# Patient Record
Sex: Female | Born: 1960
Health system: Southern US, Community
[De-identification: ages and names within clinical notes are randomized; demographics above are authoritative.]

## PROBLEM LIST (undated history)

## (undated) DIAGNOSIS — J45909 Unspecified asthma, uncomplicated: Secondary | ICD-10-CM

## (undated) DIAGNOSIS — I10 Essential (primary) hypertension: Secondary | ICD-10-CM

## (undated) DIAGNOSIS — Z1211 Encounter for screening for malignant neoplasm of colon: Secondary | ICD-10-CM

## (undated) DIAGNOSIS — E119 Type 2 diabetes mellitus without complications: Secondary | ICD-10-CM

## (undated) DIAGNOSIS — J449 Chronic obstructive pulmonary disease, unspecified: Secondary | ICD-10-CM

## (undated) HISTORY — PX: PARTIAL HYSTERECTOMY: SHX80

---
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
## 2018-09-06 ENCOUNTER — Emergency Department (HOSPITAL_COMMUNITY)
Admission: EM | Admit: 2018-09-06 | Discharge: 2018-09-06 | Disposition: A | Discharge status: Home / Self Care | Payer: Medicare Other | Attending: Emergency Medicine | Admitting: Emergency Medicine

## 2018-09-06 ENCOUNTER — Emergency Department (HOSPITAL_COMMUNITY): Payer: Medicare Other

## 2018-09-06 DIAGNOSIS — R05 Cough: Secondary | ICD-10-CM | POA: Diagnosis present

## 2018-09-06 DIAGNOSIS — J441 Chronic obstructive pulmonary disease with (acute) exacerbation: Secondary | ICD-10-CM

## 2018-09-06 DIAGNOSIS — R0981 Nasal congestion: Secondary | ICD-10-CM | POA: Diagnosis not present

## 2018-09-06 DIAGNOSIS — R062 Wheezing: Secondary | ICD-10-CM | POA: Diagnosis not present

## 2018-09-06 LAB — CBC
HCT: 39.6 % (ref 36.0–46.0)
Hemoglobin: 12.7 g/dL (ref 12.0–15.0)
MCH: 28 pg (ref 26.0–34.0)
MCHC: 32.1 g/dL (ref 30.0–36.0)
MCV: 87.2 fL (ref 80.0–100.0)
PLATELETS: 244 10*3/uL (ref 150–400)
RBC: 4.54 MIL/uL (ref 3.87–5.11)
RDW: 12.7 % (ref 11.5–15.5)
WBC: 10.2 10*3/uL (ref 4.0–10.5)
nRBC: 0 % (ref 0.0–0.2)

## 2018-09-06 LAB — INFLUENZA PANEL BY PCR (TYPE A & B)
INFLAPCR: NEGATIVE
Influenza B By PCR: NEGATIVE

## 2018-09-06 LAB — BASIC METABOLIC PANEL
Anion gap: 9 (ref 5–15)
BUN: 11 mg/dL (ref 6–20)
CO2: 26 mmol/L (ref 22–32)
Calcium: 9 mg/dL (ref 8.9–10.3)
Chloride: 103 mmol/L (ref 98–111)
Creatinine, Ser: 0.88 mg/dL (ref 0.44–1.00)
GFR calc Af Amer: >60 mL/min (ref >60)
Glucose, Bld: 216 mg/dL — ABNORMAL HIGH (ref 70–99)
Potassium: 3.5 mmol/L (ref 3.5–5.1)
SODIUM: 138 mmol/L (ref 135–145)

## 2018-09-06 MED ORDER — AZITHROMYCIN 250 MG PO TABS: 250.0000 mg | ORAL_TABLET | Freq: Every day | ORAL | 0 refills | Status: DC

## 2018-09-06 MED ORDER — BENZONATATE 100 MG PO CAPS: 100.0000 mg | ORAL_CAPSULE | Freq: Three times a day (TID) | ORAL | 0 refills | Status: DC

## 2018-09-06 MED ORDER — BENZONATATE 100 MG PO CAPS
100.0000 mg | ORAL_CAPSULE | Freq: Once | ORAL | Status: AC
  Administered 2018-09-06: 100 mg via ORAL
  Filled 2018-09-06: qty 1

## 2018-09-06 MED ORDER — IPRATROPIUM-ALBUTEROL 0.5-2.5 (3) MG/3ML IN SOLN
3.0000 mL | Freq: Once | RESPIRATORY_TRACT | Status: AC
  Administered 2018-09-06: 3 mL via RESPIRATORY_TRACT
  Filled 2018-09-06: qty 3

## 2018-09-06 MED ORDER — METHYLPREDNISOLONE SODIUM SUCC 125 MG IJ SOLR
125.0000 mg | Freq: Once | INTRAMUSCULAR | Status: AC
  Administered 2018-09-06: 125 mg via INTRAVENOUS
  Filled 2018-09-06: qty 2

## 2018-09-06 MED ORDER — ALBUTEROL SULFATE (2.5 MG/3ML) 0.083% IN NEBU
2.5000 mg | INHALATION_SOLUTION | Freq: Once | RESPIRATORY_TRACT | Status: AC
  Administered 2018-09-06: 2.5 mg via RESPIRATORY_TRACT
  Filled 2018-09-06: qty 3

## 2018-09-06 MED ORDER — PREDNISONE 20 MG PO TABS: 20.0000 mg | ORAL_TABLET | Freq: Every day | ORAL | 0 refills | Status: AC

## 2018-09-06 NOTE — ED Triage Notes (Signed)
Transported by [facility] from work--productive cough (yellow sputum), SHOB, sore throat, body aches since Monday. VSS with EMS.

## 2018-09-06 NOTE — ED Notes (Signed)
ED Provider at bedside. 

## 2018-09-06 NOTE — ED Notes (Signed)
Bed: WA17 Expected date: 09/06/18 Expected time: 8:43 AM Means of arrival:  Comments: EMS/cough/d.m.

## 2018-09-06 NOTE — ED Provider Notes (Signed)
[name] Provider Note   CSN: [id number] Arrival date & time: 09/06/18  0906     History   Chief Complaint Chief Complaint  Patient presents with  . Cough    HPI Lori Brown is a 57 y.o. female.  HPI 57 year old female, with PMH of COPD, asthma, daily smoker, diabetes, HTN, HLD, presents with a 3-day history of cough with sputum production.  She notes associated wheezing but is only present with coughing.  She denies any shortness of breath.  She notes associated rhinorrhea, nasal congestion, sore throat and body aches.  She denies fevers, chills.  She denies any chest pain.  Patient states "I feel like I have the flu."  States she has been taking her inhalers with no improvement in her symptoms.   No past medical history on file.  There are no active problems to display for this patient.   The histories are not reviewed yet. Please review them in the "History" navigator section and refresh this [place].   OB History   None      Home Medications    Prior to Admission medications   Not on File    Family History No family history on file.  Social History Social History   Tobacco Use  . Smoking status: Not on file  Substance Use Topics  . Alcohol use: Not on file  . Drug use: Not on file     Allergies   Patient has no allergy information on record.   Review of Systems Review of Systems  Constitutional: Negative for chills and fever.  HENT: Positive for rhinorrhea and sore throat.   Eyes: Positive for pain and redness.  Respiratory: Positive for cough and wheezing. Negative for shortness of breath.   Cardiovascular: Negative for chest pain.  Gastrointestinal: Negative for abdominal pain, nausea and vomiting.  Musculoskeletal: Positive for myalgias.     Physical Exam Updated Vital Signs BP (!) 138/102 (BP Location: Right Arm)   Pulse 86   Temp 98.6 F (37 C) (Oral)   Resp 20   SpO2 97%   Physical  Exam  Constitutional: She is oriented to person, place, and time. She appears well-developed and well-nourished.  HENT:  Head: Normocephalic and atraumatic.  Right Ear: Tympanic membrane normal.  Left Ear: Tympanic membrane normal.  Nose: Rhinorrhea present. No epistaxis.  Mouth/Throat: Uvula is midline. Posterior oropharyngeal erythema (mild) present.  Eyes: Conjunctivae and EOM are normal.  Neck: Neck supple.  Cardiovascular: Normal rate, regular rhythm and normal heart sounds.  No murmur heard. Pulmonary/Chest: Effort normal. No stridor. No respiratory distress. She has wheezes (expiratory wheezing throughout). She has no rales.  Abdominal: Soft. Bowel sounds are normal. She exhibits no distension. There is no tenderness.  Musculoskeletal: Normal range of motion. She exhibits no tenderness or deformity.  Neurological: She is alert and oriented to person, place, and time.  Skin: Skin is warm and dry. No rash noted. No erythema.  Psychiatric: She has a normal mood and affect. Her behavior is normal.  Nursing note and vitals reviewed.    ED Treatments / Results  Labs (all labs ordered are listed, but only abnormal results are displayed) Labs Reviewed  CBC  BASIC METABOLIC PANEL  INFLUENZA PANEL BY PCR (TYPE A & B)    EKG None  Radiology No results found.  Procedures Procedures (including critical care time)  Medications Ordered in ED Medications  methylPREDNISolone sodium succinate (SOLU-MEDROL) 125 mg/2 mL injection 125 mg (125 mg  Intravenous Given 09/06/18 0939)  ipratropium-albuterol (DUONEB) 0.5-2.5 (3) MG/3ML nebulizer solution 3 mL (3 mLs Nebulization Given 09/06/18 0949)  albuterol (PROVENTIL) (2.5 MG/3ML) 0.083% nebulizer solution 2.5 mg (2.5 mg Nebulization Given 09/06/18 0949)     Initial Impression / Assessment and Plan / ED Course  I have reviewed the triage vital signs and the nursing notes.  Pertinent labs & imaging results that were available during  my care of the patient were reviewed by me and considered in my medical decision making (see chart for details).     Resting comfortably in bed, no acute distress, nontoxic, non-lethargic.  Vital signs are stable.  Lungs are evaluated and patient moving much more air with only faint expiratory wheezes heard.  She states that she is feeling better at this time after nebs and steroids.  Continues to deny any shortness of breath, chest pain.  Does note states shortness of breath however when asked further patient notes that she is wheezing.  And afebrile, not tachycardic, O2 saturations are good.  Blood work unremarkable, chest x-ray with no pneumonia but possible acute on chronic bronchitis.  Believe this is a COPD exacerbation, will write for steroids and azithromycin for home.  Patient is diabetic.  Given strict instructions to monitor her sugars.  She expresses understanding.  Patient states she has plenty of albuterol at home.  She was instructed to take it every 4 hours for the next several days.   At this time there does not appear to be any evidence of an acute emergency medical condition and the patient appears stable for discharge with appropriate outpatient follow up.Diagnosis was discussed with patient who verbalizes understanding and is agreeable to discharge.  Final Clinical Impressions(s) / ED Diagnoses   Final diagnoses:  None    ED Discharge Orders    None       Etter Sjogren, PA-C 09/07/18 0700    Julianne Rice, MD 09/09/18 [phone]

## 2018-09-06 NOTE — Discharge Instructions (Signed)
Start Azithromycin today and take as prescribed. Take prednisone starting tomorrow. Please monitor your sugars as prednisone can cause your sugar to be elevated.  Take albuterol every 4 hours for wheezing.  Return to the ED immediately for new or worsening symptoms, such as shortness of breath, fever, chest pain, vomiting or any concerns at all.

## 2018-09-20 ENCOUNTER — Emergency Department (HOSPITAL_COMMUNITY)
Admission: EM | Admit: 2018-09-20 | Discharge: 2018-09-20 | Disposition: A | Discharge status: Home / Self Care | Payer: 59 | Attending: Emergency Medicine | Admitting: Emergency Medicine

## 2018-09-20 ENCOUNTER — Encounter: Payer: Self-pay | Admitting: Emergency Medicine

## 2018-09-20 DIAGNOSIS — Y929 Unspecified place or not applicable: Secondary | ICD-10-CM | POA: Insufficient documentation

## 2018-09-20 DIAGNOSIS — X58XXXA Exposure to other specified factors, initial encounter: Secondary | ICD-10-CM | POA: Insufficient documentation

## 2018-09-20 DIAGNOSIS — T161XXA Foreign body in right ear, initial encounter: Secondary | ICD-10-CM | POA: Insufficient documentation

## 2018-09-20 DIAGNOSIS — E119 Type 2 diabetes mellitus without complications: Secondary | ICD-10-CM | POA: Diagnosis not present

## 2018-09-20 DIAGNOSIS — Y939 Activity, unspecified: Secondary | ICD-10-CM | POA: Insufficient documentation

## 2018-09-20 DIAGNOSIS — I10 Essential (primary) hypertension: Secondary | ICD-10-CM | POA: Insufficient documentation

## 2018-09-20 DIAGNOSIS — Y999 Unspecified external cause status: Secondary | ICD-10-CM | POA: Diagnosis not present

## 2018-09-20 DIAGNOSIS — F17210 Nicotine dependence, cigarettes, uncomplicated: Secondary | ICD-10-CM | POA: Diagnosis not present

## 2018-09-20 HISTORY — DX: Essential (primary) hypertension: I10

## 2018-09-20 HISTORY — DX: Type 2 diabetes mellitus without complications: E11.9

## 2018-09-20 NOTE — ED Triage Notes (Signed)
Pt in c/o possible foreign body in her right ear, went to her MD about it and had her ear flushed but no relief

## 2018-09-20 NOTE — ED Notes (Signed)
Patient able to ambulate independently  

## 2018-09-20 NOTE — ED Provider Notes (Addendum)
[facility] EMERGENCY DEPARTMENT Provider Note   CSN: [id number] Arrival date & time: 09/20/18  1028     History   Chief Complaint Chief Complaint  Patient presents with  . Foreign Body in [place] is a 57 y.o. female presented today for foreign body sensation of the right ear that has been persistent for a week.  Patient denies pain or other concerns at this time.  Patient states that she has had a slightly irritating feeling in the right ear canal for 7 days after cleaning her ears with a cotton tip swab.  Patient states that her problem has been constant, not increased in severity and she has not taken any medications or performed any interventions to help with her symptoms.  HPI  Past Medical History:  Diagnosis Date  . Diabetes mellitus without complication ([place])   . Hypertension     There are no active problems to display for this patient.   History reviewed. No pertinent surgical history.   OB History   None      Home Medications    Prior to Admission medications   Medication Sig Start Date End Date Taking? Authorizing Provider  azithromycin (ZITHROMAX) 250 MG tablet Take 1 tablet (250 mg total) by mouth daily. Take first 2 tablets together, then 1 every day until finished. 09/06/18   Kendrick, Caitlyn S, PA-C  benzonatate (TESSALON) 100 MG capsule Take 1 capsule (100 mg total) by mouth every 8 (eight) hours. 09/06/18   Etter Sjogren, PA-C    Family History History reviewed. No pertinent family history.  Social History Social History   Tobacco Use  . Smoking status: Current Every Day Smoker  . Smokeless tobacco: Never Used  Substance Use Topics  . Alcohol use: Not on file  . Drug use: Not on file     Allergies   Patient has no known allergies.   Review of Systems Review of Systems  Constitutional: Negative.  Negative for chills and fever.  HENT: Positive for ear pain. Negative for congestion, ear  discharge, facial swelling, rhinorrhea, sore throat, trouble swallowing and voice change.   Eyes: Negative.  Negative for pain and visual disturbance.  Respiratory: Negative.  Negative for cough.   Cardiovascular: Negative.  Negative for chest pain.  Musculoskeletal: Negative.  Negative for neck pain.  Skin: Negative.  Negative for color change and wound.  Neurological: Negative.  Negative for headaches.   Physical Exam Updated Vital Signs BP (!) 151/91 (BP Location: Right Arm)   Pulse 79   Temp 98 F (36.7 C)   SpO2 100%   Respiratory rate on my evaluation was within normal limits on my evaluation.  Physical Exam  Constitutional: She appears well-developed and well-nourished. No distress.  HENT:  Head: Normocephalic and atraumatic.  Right Ear: Hearing and external ear normal. A foreign body is present. No mastoid tenderness. Tympanic membrane is not perforated, not erythematous, not retracted and not bulging. No hemotympanum.  Left Ear: Hearing, tympanic membrane, external ear and ear canal normal. No foreign bodies. No mastoid tenderness. Tympanic membrane is not perforated, not erythematous, not retracted and not bulging. No hemotympanum.  Nose: Nose normal.  Mouth/Throat: Uvula is midline, oropharynx is clear and moist and mucous membranes are normal.  Patient with small hair present in right ear canal against tympanic membrane.  The patient has normal phonation and is in control of secretions. No stridor.  Midline uvula without edema. Soft palate rises  symmetrically. No tonsillar erythema, swelling or exudates. Tongue protrusion is normal, floor of mouth is soft. No trismus. No creptius on neck palpation. No gingival erythema or fluctuance noted. Mucus membranes moist.  Eyes: Pupils are equal, round, and reactive to light. Conjunctivae and EOM are normal.  Neck: Trachea normal, normal range of motion, full passive range of motion without pain and phonation normal. Neck supple. No  tracheal deviation present.  Cardiovascular: Normal rate, regular rhythm and normal heart sounds.  Pulmonary/Chest: Effort normal and breath sounds normal. No accessory muscle usage. No respiratory distress. She has no decreased breath sounds. She has no wheezes. She has no rhonchi. She exhibits no tenderness, no crepitus and no deformity.  Abdominal: Soft. There is no tenderness. There is no rigidity, no rebound and no guarding.  Musculoskeletal: Normal range of motion.  Patient moving all extremities spontaneously and without distress.  Neurological: She is alert. She displays a negative Romberg sign. GCS eye subscore is 4. GCS verbal subscore is 5. GCS motor subscore is 6.  Speech is clear and goal oriented, follows commands Major Cranial nerves without deficit, no facial droop Moves extremities without ataxia, coordination intact Normal gait Negative Romberg  Skin: Skin is warm and dry.  Psychiatric: She has a normal mood and affect. Her behavior is normal.   ED Treatments / Results  Labs (all labs ordered are listed, but only abnormal results are displayed) Labs Reviewed - No data to display  EKG None  Radiology No results found.  Procedures .Foreign Body Removal Date/Time: 09/20/2018 11:48 AM Performed by: Deliah Boston, PA-C Authorized by: Deliah Boston, PA-C  Consent: Verbal consent obtained. Risks and benefits: risks, benefits and alternatives were discussed Consent given by: patient Patient understanding: patient states understanding of the procedure being performed Patient consent: the patient's understanding of the procedure matches consent given Procedure consent: procedure consent matches procedure scheduled Relevant documents: relevant documents present and verified Test results: test results available and properly labeled Site marked: the operative site was marked Required items: required blood products, implants, devices, and special equipment  available Patient identity confirmed: verbally with patient and arm band Time out: Immediately prior to procedure a "time out" was called to verify the correct patient, procedure, equipment, support staff and site/side marked as required. Body area: ear  Sedation: Patient sedated: no  Patient restrained: no Patient cooperative: yes Localization method: visualized Removal mechanism: irrigation Complexity: simple 1 objects recovered. Objects recovered: hair Post-procedure assessment: foreign body removed Patient tolerance: Patient tolerated the procedure well with no immediate complications Comments: Tympanic membrane reexamined, normal appearing, no perforation, no bleeding.  Hair has been successfully removed.   (including critical care time)  Medications Ordered in ED Medications - No data to display   Initial Impression / Assessment and Plan / ED Course  I have reviewed the triage vital signs and the nursing notes.  Pertinent labs & imaging results that were available during my care of the patient were reviewed by me and considered in my medical decision making (see chart for details).    57 year old female presents today for foreign body sensation of the right ear for 7 days.  Examination today reveals small hair present to right ear canal pressing up against the tympanic membrane without perforation.  As above this was irrigated by myself with removal of the foreign body and relief of patient's symptoms.  After removal patient states that she is feeling well and is without complaint.  At discharge patient is afebrile,  not tachycardic, not hypotensive, well-appearing and in no acute distress.  Respiratory rate was not charted by nursing staff however she is breathing 16 times per minute on my evaluation.  The patient was noted to have elevated BP in ED today. I have spoken with the patient regarding elevated blood pressure readings and the need for improved management. I  instructed the patient to followup with their PCP within 1 week for BP check. I also counseled the patient regarding the signs and symptoms which would require an emergent visit to an emergency department for hypertensive urgency and/or hypertensive emergency.  At this time there does not appear to be any evidence of an acute emergency medical condition and the patient appears stable for discharge with appropriate outpatient follow up. Diagnosis was discussed with patient who verbalizes understanding of care plan and is agreeable to discharge. I have discussed return precautions with patient who verbalizes understanding of return precautions. Patient strongly encouraged to follow-up with their PCP within one week. All questions answered.  Note: Portions of this report may have been transcribed using voice recognition software. Every effort was made to ensure accuracy; however, inadvertent computerized transcription errors may still be present. Final Clinical Impressions(s) / ED Diagnoses   Final diagnoses:  Foreign body of right ear, initial encounter    ED Discharge Orders    None       Gari Crown 09/20/18 1158    Gari Crown 09/20/18 1212    Maudie Flakes, MD 09/20/18 [phone]

## 2018-09-20 NOTE — Discharge Instructions (Addendum)
You have been diagnosed today with foreign body of the right ear.  At this time there does not appear to be the presence of an emergent medical condition, however there is always the potential for conditions to change. Please read and follow the below instructions.  Please return to the Emergency Department immediately for any new or worsening symptoms. Please be sure to follow up with your Primary Care Provider next week regarding your visit today; please call their office to schedule an appointment even if you are feeling better for a follow-up visit. Your blood pressure was elevated today.  Please take your blood pressure medications as prescribed by primary care provider and follow-up with them for blood pressure recheck within 1 week.  Seek Medical Assistance if: You have a headache. Your have blood coming from your ear. You have a fever. You have increased pain or swelling of your ear. Your hearing is reduced. You have discharge coming from your ear.  Please read the additional information packets attached to your discharge summary.  Do not take your medicine if  develop an itchy rash, swelling in your mouth or lips, or difficulty breathing.

## 2018-09-22 ENCOUNTER — Encounter: Payer: Self-pay | Admitting: Pediatric Intensive Care

## 2018-09-22 ENCOUNTER — Telehealth: Payer: Self-pay

## 2018-09-22 MED FILL — SIMVASTATIN 20 MG TABLET: 20 | 30 days supply | Qty: 30 | Fill #0

## 2018-09-22 MED FILL — AMLODIPINE BESYLATE 10 MG T: 10 | 30 days supply | Qty: 30 | Fill #0

## 2018-09-22 MED FILL — metFORMIN HCL 1000 MG TABS: 1000 | 30 days supply | Qty: 60 | Fill #0

## 2018-09-22 NOTE — Telephone Encounter (Signed)
Message received from Lisette Abu, [place] requesting an appointment for the patient with Dr Joya Gaskins. An appointment has been scheduled for 09/29/18 @ 1100.

## 2018-09-24 ENCOUNTER — Other Ambulatory Visit: Payer: Self-pay

## 2018-09-24 ENCOUNTER — Encounter (HOSPITAL_COMMUNITY): Payer: Self-pay | Admitting: Emergency Medicine

## 2018-09-24 ENCOUNTER — Emergency Department (HOSPITAL_COMMUNITY)
Admission: EM | Admit: 2018-09-24 | Discharge: 2018-09-24 | Disposition: A | Discharge status: Home / Self Care | Payer: 59 | Attending: Emergency Medicine | Admitting: Emergency Medicine

## 2018-09-24 ENCOUNTER — Emergency Department (HOSPITAL_COMMUNITY): Payer: 59

## 2018-09-24 DIAGNOSIS — Z8709 Personal history of other diseases of the respiratory system: Secondary | ICD-10-CM

## 2018-09-24 DIAGNOSIS — R0602 Shortness of breath: Secondary | ICD-10-CM | POA: Insufficient documentation

## 2018-09-24 DIAGNOSIS — R05 Cough: Secondary | ICD-10-CM | POA: Diagnosis not present

## 2018-09-24 DIAGNOSIS — I10 Essential (primary) hypertension: Secondary | ICD-10-CM | POA: Insufficient documentation

## 2018-09-24 DIAGNOSIS — E119 Type 2 diabetes mellitus without complications: Secondary | ICD-10-CM | POA: Diagnosis not present

## 2018-09-24 DIAGNOSIS — R059 Cough, unspecified: Secondary | ICD-10-CM

## 2018-09-24 DIAGNOSIS — F17210 Nicotine dependence, cigarettes, uncomplicated: Secondary | ICD-10-CM | POA: Diagnosis not present

## 2018-09-24 DIAGNOSIS — J449 Chronic obstructive pulmonary disease, unspecified: Secondary | ICD-10-CM | POA: Diagnosis not present

## 2018-09-24 HISTORY — DX: Unspecified asthma, uncomplicated: J45.909

## 2018-09-24 HISTORY — DX: Chronic obstructive pulmonary disease, unspecified: J44.9

## 2018-09-24 LAB — CBC
HCT: 37.1 % (ref 36.0–46.0)
HEMOGLOBIN: 12.1 g/dL (ref 12.0–15.0)
MCH: 26.8 pg (ref 26.0–34.0)
MCHC: 32.6 g/dL (ref 30.0–36.0)
MCV: 82.3 fL (ref 80.0–100.0)
NRBC: 0 % (ref 0.0–0.2)
PLATELETS: 223 10*3/uL (ref 150–400)
RBC: 4.51 MIL/uL (ref 3.87–5.11)
RDW: 12.3 % (ref 11.5–15.5)
WBC: 5.3 10*3/uL (ref 4.0–10.5)

## 2018-09-24 LAB — BASIC METABOLIC PANEL
ANION GAP: 9 (ref 5–15)
BUN: 8 mg/dL (ref 6–20)
CALCIUM: 8.9 mg/dL (ref 8.9–10.3)
CHLORIDE: 100 mmol/L (ref 98–111)
CO2: 23 mmol/L (ref 22–32)
Creatinine, Ser: 0.86 mg/dL (ref 0.44–1.00)
GFR calc non Af Amer: >60 mL/min (ref >60)
GLUCOSE: 382 mg/dL — AB (ref 70–99)
POTASSIUM: 3.5 mmol/L (ref 3.5–5.1)
Sodium: 132 mmol/L — ABNORMAL LOW (ref 135–145)

## 2018-09-24 LAB — INFLUENZA PANEL BY PCR (TYPE A & B)
Influenza A By PCR: NEGATIVE
Influenza B By PCR: NEGATIVE

## 2018-09-24 LAB — I-STAT TROPONIN, ED: TROPONIN I, POC: 0 ng/mL (ref 0.00–0.08)

## 2018-09-24 LAB — GROUP A STREP BY PCR: GROUP A STREP BY PCR: NOT DETECTED

## 2018-09-24 MED ORDER — ALBUTEROL SULFATE (2.5 MG/3ML) 0.083% IN NEBU
5.0000 mg | INHALATION_SOLUTION | Freq: Once | RESPIRATORY_TRACT | Status: AC
  Administered 2018-09-24: 5 mg via RESPIRATORY_TRACT
  Filled 2018-09-24: qty 6

## 2018-09-24 MED ORDER — PREDNISONE 20 MG PO TABS
60.0000 mg | ORAL_TABLET | Freq: Once | ORAL | Status: AC
  Administered 2018-09-24: 60 mg via ORAL
  Filled 2018-09-24: qty 3

## 2018-09-24 MED ORDER — IPRATROPIUM-ALBUTEROL 0.5-2.5 (3) MG/3ML IN SOLN
3.0000 mL | Freq: Once | RESPIRATORY_TRACT | Status: AC
  Administered 2018-09-24: 3 mL via RESPIRATORY_TRACT
  Filled 2018-09-24: qty 3

## 2018-09-24 MED ORDER — ALBUTEROL SULFATE HFA 108 (90 BASE) MCG/ACT IN AERS
2.0000 | INHALATION_SPRAY | Freq: Once | RESPIRATORY_TRACT | Status: AC
  Administered 2018-09-24: 2 via RESPIRATORY_TRACT
  Filled 2018-09-24: qty 6.7

## 2018-09-24 MED ORDER — AMLODIPINE BESYLATE 10 MG PO TABS: 10.0000 mg | ORAL_TABLET | Freq: Every day | ORAL | 0 refills | Status: DC

## 2018-09-24 MED ORDER — AZITHROMYCIN 250 MG PO TABS: 250.0000 mg | ORAL_TABLET | Freq: Every day | ORAL | 0 refills | Status: DC

## 2018-09-24 MED ORDER — PREDNISONE 10 MG PO TABS: 40.0000 mg | ORAL_TABLET | Freq: Every day | ORAL | 0 refills | Status: DC

## 2018-09-24 NOTE — ED Notes (Signed)
During ambulation, pt's O2 was between 96%-98%. Pt also stated that she felt "a little dizzy".  Informed Michael - PA.

## 2018-09-24 NOTE — Discharge Instructions (Addendum)
You were seen here for a COPD excaberation.  Albuterol inhaler - this medication will help open up your airway. Use inhaler as follows: 1-2 puffs with spacer every 4 hours as needed for wheezing, cough, or shortness of breath.   Prednisone - This is an oral steroid.  This medication is best taken with food in the morning.  Please note that this medication can cause anxiety, mood swings, muscle fatigue, increased hunger, weight gain (sodium/fluid retention), poor sleep as well as other symptoms. If you are a diabetic, please monitor your blood sugars at home as this medication can increase your blood sugars.   I have refilled your BP medication. Please take as prescribed. Please follow up with your pcp in the next 1 week for follow up.   Please take all of your antibiotics until finished!   You may develop abdominal discomfort or diarrhea from the antibiotic.  You may help offset this with probiotics which you can buy or get in yogurt. Do not eat or take the probiotics until 2 hours after your antibiotic. Do not take your medicine if develop an itchy rash, swelling in your mouth or lips, or difficulty breathing.   If you develop worsening or new concerning symptoms you can return to the emergency department for re-evaluation.

## 2018-09-24 NOTE — ED Triage Notes (Signed)
Onset 08/21/18 cough, productive, yellow and shortness of breath.  .  Pt states inhalers not working.  Pt talking in complete sentences.   Pt out of BP medication, PCP office hasn't filled yet.  Would like to get prescription today.  Coughing causing vomiting last night.

## 2018-09-24 NOTE — ED Provider Notes (Signed)
[facility] EMERGENCY DEPARTMENT Provider Note   CSN: [id number] Arrival date & time: 09/24/18  1331     History   Chief Complaint Chief Complaint  Patient presents with  . Cough  . Shortness of Breath    HPI Lori Brown is a 57 y.o. female with a history of asthma, COPD, tobacco abuse, hypertension, diabetes who presents emergency department today for shortness of breath.  Patient reports that 3-4 days ago she began having nasal congestion, sore throat with dysphasia, a productive cough with light yellow sputum, shortness of breath as well as chest tightness.  She notes that she typically is short of breath prior to and after an episode of coughing.  She notes she coughs every 10-15 minutes.  She reports that she is also short of breath with exertion such as walking upstairs.  She denies any chest pain associated with exertion.  She notes that over the last 24 hours she now feels some chest tightness and increasing amount of wheezing.  She has used 3 albuterol nebulizers at home prior to arrival today without any relief.  She reports her chest also feels sore secondary to coughing.  She denies any pleuritic or positional chest pain.  Patient reports this is similar to prior COPD exacerbations.  She notes that she was most recently here last month for an exacerbation which noted was improved with a Z-Pak and prednisone.  The patient denies any fevers at home.  No history of CHF.  She denies any lower extremity swelling.  She denies any exogenous estrogen use, recent surgery, recent travel, immobilization, personal history of cancer, history of PE/DVT, hemoptysis, or any significant radiation of her chest pain.  HPI  Past Medical History:  Diagnosis Date  . Asthma   . COPD (chronic obstructive pulmonary disease) ([place])   . Diabetes mellitus without complication ([place])   . Hypertension     There are no active problems to display for this patient.   History reviewed. No  pertinent surgical history.   OB History   None      Home Medications    Prior to Admission medications   Medication Sig Start Date End Date Taking? Authorizing Provider  azithromycin (ZITHROMAX) 250 MG tablet Take 1 tablet (250 mg total) by mouth daily. Take first 2 tablets together, then 1 every day until finished. 09/06/18   Kendrick, Caitlyn S, PA-C  benzonatate (TESSALON) 100 MG capsule Take 1 capsule (100 mg total) by mouth every 8 (eight) hours. 09/06/18   Etter Sjogren, PA-C    Family History History reviewed. No pertinent family history.  Social History Social History   Tobacco Use  . Smoking status: Current Every Day Smoker    Packs/day: 0.10    Types: Cigarettes  . Smokeless tobacco: Never Used  Substance Use Topics  . Alcohol use: Yes    Comment: rarely   . Drug use: Not Currently     Allergies   Patient has no known allergies.   Review of Systems Review of Systems  All other systems reviewed and are negative.    Physical Exam Updated Vital Signs BP (!) 176/98   Pulse 83   Temp 99 F (37.2 C) (Oral)   Resp 19   SpO2 97%   Physical Exam  Constitutional: She appears well-developed and well-nourished.  HENT:  Head: Normocephalic and atraumatic.  Right Ear: External ear normal.  Left Ear: External ear normal.  Nose: Nose normal.  Mouth/Throat: Uvula is midline,  oropharynx is clear and moist and mucous membranes are normal. No tonsillar exudate.  Eyes: Pupils are equal, round, and reactive to light. Right eye exhibits no discharge. Left eye exhibits no discharge. No scleral icterus.  Neck: Trachea normal. Neck supple. No spinous process tenderness present. No neck rigidity. Normal range of motion present.  No nuchal rigidity or meningismus  Cardiovascular: Normal rate, regular rhythm and intact distal pulses.  No murmur heard. Pulses:      Radial pulses are 2+ on the right side, and 2+ on the left side.       Dorsalis pedis pulses are  2+ on the right side, and 2+ on the left side.       Posterior tibial pulses are 2+ on the right side, and 2+ on the left side.  No lower extremity swelling or edema. Calves symmetric in size bilaterally.  Pulmonary/Chest: Effort normal. She has wheezes (global, end expiratory). She exhibits no tenderness.  No increased work of breathing. No accessory muscle use. Patient is sitting upright, speaking in full sentences without difficulty   Abdominal: Soft. Bowel sounds are normal. There is no tenderness. There is no rebound and no guarding.  Musculoskeletal: She exhibits no edema.  Lymphadenopathy:    She has no cervical adenopathy.  Neurological: She is alert.  Skin: Skin is warm and dry. No rash noted. She is not diaphoretic.  Psychiatric: She has a normal mood and affect.  Nursing note and vitals reviewed.    ED Treatments / Results  Labs (all labs ordered are listed, but only abnormal results are displayed) Labs Reviewed  BASIC METABOLIC PANEL - Abnormal; Notable for the following components:      Result Value   Sodium 132 (*)    Glucose, Bld 382 (*)    All other components within normal limits  GROUP A STREP BY PCR  CBC  INFLUENZA PANEL BY PCR (TYPE A & B)  I-STAT TROPONIN, ED    EKG EKG Interpretation  Date/Time:  Sunday September 24 2018 14:05:48 EST Ventricular Rate:  83 PR Interval:    QRS Duration: 81 QT Interval:  381 QTC Calculation: 448 R Axis:   78 Text Interpretation:  Sinus rhythm Biatrial enlargement Left ventricular hypertrophy no prior to compare with Confirmed by Aletta Edouard [phone]) on 09/24/2018 2:08:09 PM   Radiology Dg Chest 2 View  Result Date: 09/24/2018 CLINICAL DATA:  Productive cough and shortness of breath. EXAM: CHEST - 2 VIEW COMPARISON:  09/06/2018 FINDINGS: The heart is mildly enlarged. Stable mild bronchial and interstitial prominence which likely reflects chronic disease. A component of acute bronchitis cannot be excluded. There is  mild bibasilar atelectasis. There is no evidence of pulmonary edema, consolidation, pneumothorax, nodule or pleural fluid. IMPRESSION: Stable interstitial and bronchial prominence. This could be reflective of chronic disease but component of acute bronchitis may be present. Electronically Signed   By: Aletta Edouard M.D.   On: 09/24/2018 15:21    Procedures Procedures (including critical care time)  Medications Ordered in ED Medications  ipratropium-albuterol (DUONEB) 0.5-2.5 (3) MG/3ML nebulizer solution 3 mL (has no administration in time range)  predniSONE (DELTASONE) tablet 60 mg (has no administration in time range)  albuterol (PROVENTIL) (2.5 MG/3ML) 0.083% nebulizer solution 5 mg (5 mg Nebulization Given 09/24/18 1356)     Initial Impression / Assessment and Plan / ED Course  I have reviewed the triage vital signs and the nursing notes.  Pertinent labs & imaging results that were available during my  care of the patient were reviewed by me and considered in my medical decision making (see chart for details).     57 y.o. female presenting for sob, cough, wheezing and chest tightness.  Vital signs are reassuring on presentation.  Patient does have mild wheezing expiratory on exam.  EKG normal sinus rhythm.  Troponin within normal limits.  Do not suspect ACS.  Lab work otherwise reassuring.  Strep test negative.  Influenza test negative.  No concerns for PTA or RPA.  Do not suspect PE at this time. CXR unremarkable.   4:03 PM After second DuoNeb patient's shortness breath is fully resolved.  Lungs are now clear to auscultation bilaterally.  Will plan to ambulate with pulse ox to make sure patient does not become hypoxic.  Patient ambulated in ED with O2 saturations maintained >95%, no current signs of respiratory distress. Lung exam improved after nebulizer treatment. Prednisone given in the ED and pt will be discharged with 5 day burst. Pt states they are breathing at baseline. Pt has  been instructed to continue using prescribed medications and to speak with PCP about today's exacerbation.  Will also give azithromycin given patient's change in cough.  Return precautions discussed.  Patient appears safe for discharge.  Final Clinical Impressions(s) / ED Diagnoses   Final diagnoses:  Cough  Shortness of breath  History of COPD    ED Discharge Orders         Ordered    amLODipine (NORVASC) 10 MG tablet  Daily     09/24/18 1647    azithromycin (ZITHROMAX) 250 MG tablet  Daily     09/24/18 1647    predniSONE (DELTASONE) 10 MG tablet  Daily     09/24/18 1647           Lorelle Gibbs 09/24/18 1805    Hayden Rasmussen, MD 09/25/18 [phone]

## 2018-09-28 ENCOUNTER — Telehealth: Payer: Self-pay | Admitting: Pediatric Intensive Care

## 2018-09-28 ENCOUNTER — Other Ambulatory Visit: Payer: Self-pay | Admitting: *Deleted

## 2018-09-28 NOTE — Telephone Encounter (Signed)
CN call to client to remind her of appointment at [facility] at 1100 09/29/2018. Advised client to meet CN at clinic office at 0830 to discuss transportation to appointment. Client agrees to plan. [place] CNP [phone]

## 2018-09-28 NOTE — Congregational Nurse Program (Signed)
  Dept: [facility] Nurse Program Note  Date of Encounter: 09/22/2018  Past Medical History: Past Medical History:  Diagnosis Date  . Asthma   . COPD (chronic obstructive pulmonary disease) ([place])   . Diabetes mellitus without complication ([place])   . Hypertension     Encounter Details:New client encounter. Client has moved here from [place]. She states she has Medicaid in [place]. She does not have a PCP locally. She states history of COPD, diabetes, hypertension, breast cancer and anxiety. She endorses bilateral leg pain which she states is due to past chemotherapy. She states that she has none of her medication but produced a bottle of metformin and the found sertraline in her bag. She states she uses a nebulizer 4x day as well as a scheduled medication but does not have access to those medications. She smokes "some". She states she has used CPAP for sleep. She states that she needs a colonoscopy and a follow up mammogram. BBS- CTA with good air movement. HRR, cap refill <2sec on hands. She has bilateral 2+ edema of feet and ankles. Client was evaluated briefly by Dr Tia Masker. CN will arrange follow up at [facility] ASAP. Client agrees to plan.

## 2018-09-29 ENCOUNTER — Encounter: Payer: Self-pay | Admitting: Critical Care Medicine

## 2018-09-29 ENCOUNTER — Encounter: Payer: Self-pay | Admitting: Pediatric Intensive Care

## 2018-09-29 ENCOUNTER — Other Ambulatory Visit: Payer: Self-pay

## 2018-09-29 ENCOUNTER — Ambulatory Visit: Payer: Medicaid Other | Attending: Critical Care Medicine | Admitting: Critical Care Medicine

## 2018-09-29 VITALS — BP 152/96 | HR 80 | Temp 98.5°F | Resp 16 | Wt 232.8 lb

## 2018-09-29 DIAGNOSIS — B370 Candidal stomatitis: Secondary | ICD-10-CM | POA: Insufficient documentation

## 2018-09-29 DIAGNOSIS — E1165 Type 2 diabetes mellitus with hyperglycemia: Secondary | ICD-10-CM | POA: Insufficient documentation

## 2018-09-29 DIAGNOSIS — Z9221 Personal history of antineoplastic chemotherapy: Secondary | ICD-10-CM | POA: Insufficient documentation

## 2018-09-29 DIAGNOSIS — M21611 Bunion of right foot: Secondary | ICD-10-CM | POA: Insufficient documentation

## 2018-09-29 DIAGNOSIS — E785 Hyperlipidemia, unspecified: Secondary | ICD-10-CM | POA: Insufficient documentation

## 2018-09-29 DIAGNOSIS — J449 Chronic obstructive pulmonary disease, unspecified: Secondary | ICD-10-CM | POA: Insufficient documentation

## 2018-09-29 DIAGNOSIS — Z1211 Encounter for screening for malignant neoplasm of colon: Secondary | ICD-10-CM | POA: Insufficient documentation

## 2018-09-29 DIAGNOSIS — E1169 Type 2 diabetes mellitus with other specified complication: Secondary | ICD-10-CM

## 2018-09-29 DIAGNOSIS — I10 Essential (primary) hypertension: Secondary | ICD-10-CM | POA: Insufficient documentation

## 2018-09-29 DIAGNOSIS — M21612 Bunion of left foot: Secondary | ICD-10-CM | POA: Insufficient documentation

## 2018-09-29 DIAGNOSIS — C50512 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: Secondary | ICD-10-CM

## 2018-09-29 DIAGNOSIS — IMO0002 Reserved for concepts with insufficient information to code with codable children: Secondary | ICD-10-CM | POA: Insufficient documentation

## 2018-09-29 DIAGNOSIS — G4733 Obstructive sleep apnea (adult) (pediatric): Secondary | ICD-10-CM | POA: Insufficient documentation

## 2018-09-29 DIAGNOSIS — F17210 Nicotine dependence, cigarettes, uncomplicated: Secondary | ICD-10-CM | POA: Insufficient documentation

## 2018-09-29 DIAGNOSIS — Z79899 Other long term (current) drug therapy: Secondary | ICD-10-CM | POA: Insufficient documentation

## 2018-09-29 DIAGNOSIS — Z794 Long term (current) use of insulin: Secondary | ICD-10-CM | POA: Insufficient documentation

## 2018-09-29 DIAGNOSIS — Z853 Personal history of malignant neoplasm of breast: Secondary | ICD-10-CM | POA: Insufficient documentation

## 2018-09-29 HISTORY — DX: Encounter for screening for malignant neoplasm of colon: Z12.11

## 2018-09-29 LAB — GLUCOSE, POCT (MANUAL RESULT ENTRY)
POC Glucose: 586 mg/dl — AB (ref 70–99)
POC Glucose: 553 mg/dl — AB (ref 70–99)
POC Glucose: 513 mg/dl — AB (ref 70–99)

## 2018-09-29 LAB — POCT GLYCOSYLATED HEMOGLOBIN (HGB A1C): Hemoglobin A1C: 12.2 % — AB (ref 4.0–5.6)

## 2018-09-29 LAB — POCT ABI - SCREENING FOR PILOT NO CHARGE
Immediate ABI left: 1.3
Immediate ABI right: 1.31

## 2018-09-29 MED ORDER — BUDESONIDE-FORMOTEROL FUMARATE 160-4.5 MCG/ACT IN AERO: 2.0000 | INHALATION_SPRAY | Freq: Two times a day (BID) | RESPIRATORY_TRACT | 12 refills | Status: DC

## 2018-09-29 MED ORDER — BENZONATATE 100 MG PO CAPS: 100.0000 mg | ORAL_CAPSULE | Freq: Three times a day (TID) | ORAL | 0 refills | Status: DC | PRN

## 2018-09-29 MED ORDER — INSULIN ASPART 100 UNIT/ML ~~LOC~~ SOLN
10.0000 [IU] | Freq: Once | SUBCUTANEOUS | Status: AC
  Administered 2018-09-29: 10 [IU] via SUBCUTANEOUS

## 2018-09-29 MED ORDER — METFORMIN HCL 1000 MG PO TABS: 1000.0000 mg | ORAL_TABLET | Freq: Two times a day (BID) | ORAL | 6 refills | Status: DC

## 2018-09-29 MED ORDER — NYSTATIN 100000 UNIT/ML MT SUSP: 5.0000 mL | Freq: Four times a day (QID) | OROMUCOSAL | 0 refills | Status: DC

## 2018-09-29 MED ORDER — INSULIN GLARGINE 100 UNIT/ML ~~LOC~~ SOLN: 20.0000 [IU] | Freq: Every day | SUBCUTANEOUS | 6 refills | Status: DC

## 2018-09-29 MED ORDER — ALBUTEROL SULFATE (2.5 MG/3ML) 0.083% IN NEBU: 2.5000 mg | INHALATION_SOLUTION | Freq: Four times a day (QID) | RESPIRATORY_TRACT | 12 refills | Status: DC | PRN

## 2018-09-29 MED FILL — metFORMIN HCL 1000 MG TABS: 1000 | 30 days supply | Qty: 60 | Fill #0

## 2018-09-29 MED FILL — SYMBICORT 160-4.5 MCG INH: 160-4.5 | 30 days supply | Qty: 10 | Fill #0

## 2018-09-29 MED FILL — NYSTATIN 100,000 UNITS/ML S: 100000 | 3 days supply | Qty: 60 | Fill #0

## 2018-09-29 MED FILL — !LANTUS 100 UNITS/ML VIAL: 100 | 28 days supply | Qty: 10 | Fill #0

## 2018-09-29 MED FILL — BENZONATATE 100 MG CAP: 100 | 20 days supply | Qty: 60 | Fill #0

## 2018-09-29 MED FILL — ALBUTEROL SUL 2.5 MG/3 ML S: (2.5 MG/3ML | 6 days supply | Qty: 75 | Fill #0

## 2018-09-29 NOTE — Congregational Nurse Program (Signed)
  Dept: [facility] Nurse Program Note  Date of Encounter: 09/29/2018  Past Medical History: Past Medical History:  Diagnosis Date  . Asthma   . COPD (chronic obstructive pulmonary disease) ([place])   . Diabetes mellitus without complication ([place])   . Hypertension     Encounter Details:CN clinic follow up prior to PCP clinic appointment. BP/BG check. Taxi vouchers to clinic appointment given. Follow up on Wednesday with D Beazer CN.

## 2018-09-29 NOTE — Assessment & Plan Note (Signed)
Bilateral plantar foot bunions in a diabetic Note the patient has been attempting to treat the bunions herself with a knife to cut off excess tissue  The patient was counseled against this  Plan Referral to podiatry

## 2018-09-29 NOTE — Assessment & Plan Note (Signed)
Need for colon cancer screening Plan We will send fecal for immunologic study for colon cancer screening

## 2018-09-29 NOTE — Progress Notes (Signed)
Subjective:    Patient ID: Lori Brown, female    DOB: 1961/07/27, 57 y.o.   MRN: [id number]  57 y.o. F with DM here to establish This patient currently lives in a local homeless shelter. She has just recently moved to [place] 30 days ago.  She is planning on achieving a home within the next few weeks. This patient has longstanding diabetes, hypertension, COPD, hyperlipidemia, and obstructive sleep apnea.  Review diabetes assessment below   Diabetes  She presents for her initial diabetic visit. She has type 2 (Dx DM2 for 57yrs) diabetes mellitus. The initial diagnosis of diabetes was made 7 years ago. Her disease course has been worsening (just started insulin 5 mo ago). Associated symptoms include blurred vision, polydipsia, polyphagia, polyuria, visual change and weakness. Pertinent negatives for diabetes include no chest pain, no fatigue, no foot paresthesias and no foot ulcerations. Symptoms are worsening. Pertinent negatives for diabetic complications include no CVA, heart disease, nephropathy, peripheral neuropathy, PVD or retinopathy. (Last Eye exam in [place]: no retinopathy) Risk factors for coronary artery disease include diabetes mellitus, family history, obesity, hypertension, sedentary lifestyle, stress, tobacco exposure and dyslipidemia. Current diabetic treatment includes insulin injections and oral agent (monotherapy). She is compliant with treatment some of the time. She is following a generally unhealthy diet. She has not had a previous visit with a dietitian. She rarely participates in exercise. She does not see a podiatrist.Eye exam is current.    Past Medical History:  Diagnosis Date  . Asthma   . COPD (chronic obstructive pulmonary disease) ([place])   . Diabetes mellitus without complication ([place])   . Hypertension      History reviewed. No pertinent family history.   Social History   Socioeconomic History  . Marital status: Single    Spouse name: Not on file    . Number of children: Not on file  . Years of education: Not on file  . Highest education level: Not on file  Occupational History  . Not on file  Social Needs  . Financial resource strain: Not on file  . Food insecurity:    Worry: Not on file    Inability: Not on file  . Transportation needs:    Medical: Not on file    Non-medical: Not on file  Tobacco Use  . Smoking status: Current Every Day Smoker    Packs/day: 0.10    Types: Cigarettes  . Smokeless tobacco: Never Used  Substance and Sexual Activity  . Alcohol use: Yes    Comment: rarely   . Drug use: Not Currently  . Sexual activity: Not on file  Lifestyle  . Physical activity:    Days per week: Not on file    Minutes per session: Not on file  . Stress: Not on file  Relationships  . Social connections:    Talks on phone: Not on file    Gets together: Not on file    Attends religious service: Not on file    Active member of club or organization: Not on file    Attends meetings of clubs or organizations: Not on file    Relationship status: Not on file  . Intimate partner violence:    Fear of current or ex partner: Not on file    Emotionally abused: Not on file    Physically abused: Not on file    Forced sexual activity: Not on file  Other Topics Concern  . Not on file  Social History Narrative  .  Not on file     No Known Allergies   Outpatient Medications Prior to Visit  Medication Sig Dispense Refill  . amLODipine (NORVASC) 10 MG tablet Take 1 tablet (10 mg total) by mouth daily. 30 tablet 0  . sertraline (ZOLOFT) 100 MG tablet Take 100 mg by mouth daily.    . simvastatin (ZOCOR) 20 MG tablet Take 20 mg by mouth daily.  0  . traZODone (DESYREL) 50 MG tablet Take 50 mg by mouth at bedtime.    . insulin glargine (LANTUS) 100 UNIT/ML injection Inject 10 Units into the skin at bedtime.    . metFORMIN (GLUCOPHAGE) 1000 MG tablet Take 1,000 mg by mouth at bedtime.   0  . azithromycin (ZITHROMAX) 250 MG tablet  Take 1 tablet (250 mg total) by mouth daily. Take first 2 tablets together, then 1 every day until finished. (Patient not taking: Reported on 09/29/2018) 6 tablet 0  . benzonatate (TESSALON) 100 MG capsule Take 1 capsule (100 mg total) by mouth every 8 (eight) hours. (Patient not taking: Reported on 09/29/2018) 21 capsule 0  . predniSONE (DELTASONE) 10 MG tablet Take 4 tablets (40 mg total) by mouth daily for 5 days. (Patient not taking: Reported on 09/29/2018) 20 tablet 0   No facility-administered medications prior to visit.      Review of Systems  Constitutional: Negative for fatigue.  HENT: Negative.   Eyes: Positive for blurred vision.  Respiratory: Positive for cough, chest tightness, shortness of breath and wheezing.   Cardiovascular: Negative for chest pain.  Endocrine: Positive for polydipsia, polyphagia and polyuria.  Genitourinary: Negative.   Neurological: Positive for weakness.  Hematological: Negative.        Objective:   Physical Exam Vitals:   09/29/18 1118  BP: (!) 152/96  Pulse: 80  Resp: 16  Temp: 98.5 F (36.9 C)  TempSrc: Oral  SpO2: 96%  Weight: 232 lb 12.8 oz (105.6 kg)    Gen: Pleasant, obese, in no distress,  normal affect  ENT: No lesions,  mouth prominent posterior oral candidiasis,  oropharynx clear, no postnasal drip  Neck: No JVD, no TMG, no carotid bruits  Lungs: No use of accessory muscles, no dullness to percussion, distant breath sounds, scattered expiratory wheezes   cardiovascular: RRR, heart sounds normal, no murmur or gallops  no peripheral edema  Abdomen: soft and NT, no HSM,  BS normal  Musculoskeletal: No deformities, no cyanosis or clubbing  Neuro: alert, non focal  Skin: Warm, no lesions or rashes  Ext: There are bunions on both plantar surfaces of both feet around the great toe and anterior portion of the foot, there is no evidence of any ulceration or infection  CBG 553>>513 after insulin HgbA1C:  12  ABI normal      Assessment & Plan:  I personally reviewed all images and lab data in the [facility] system as well as any outside material available during this office visit and agree with the  radiology impressions.   HTN (hypertension) Hypertension with poor control Plan Continue amlodipine 10 mg daily   COPD with chronic bronchitis (HCC) Chronic obstructive lung disease with chronic bronchitis Plan Begin Symbicort 2 puffs twice daily Use albuterol as needed by nebulizer  OSA (obstructive sleep apnea) Obstructive sleep apnea, not currently using CPAP therapy  Continue oxygen therapy 2 L at bedtime  Oral candidiasis Oral candidiasis  Plan Begin nystatin mouthwash  DM (diabetes mellitus), type 2, uncontrolled ([place]) Diabetes type 2 with poor control and elevated  hemoglobin A1c Plan Increase Lantus 20 units at bedtime Increase metformin 1000 mg twice daily Return for recheck with clinical pharmacist in 1 week  Hyperlipidemia associated with type 2 diabetes mellitus ([place]) Hyperlipidemia associated with type 2 diabetes  Continue antilipid therapy Follow-up lipid panel  Bilateral bunions Bilateral plantar foot bunions in a diabetic Note the patient has been attempting to treat the bunions herself with a knife to cut off excess tissue  The patient was counseled against this  Plan Referral to podiatry  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  Diagnoses and all orders for this visit:  Uncontrolled type 2 diabetes mellitus with hyperglycemia (HCC) -     HgB A1c -     Glucose (CBG) -     Ambulatory  referral to Podiatry -     POCT ABI Screening Pilot No Charge -     Comprehensive metabolic panel -     CBC with Differential/Platelet; Future -     Microalbumin/Creatinine Ratio, Urine -     insulin aspart (novoLOG) injection 10 Units -     insulin aspart (novoLOG) injection 10 Units -     Glucose (CBG)  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, unspecified estrogen receptor status ([place]) -     Ambulatory referral to Oncology -     CBC with Differential/Platelet; Future  Oral candidiasis -     CBC with Differential/Platelet; Future  COPD with chronic bronchitis (HCC)  OSA (obstructive sleep apnea)  Essential hypertension -     Comprehensive metabolic panel -     Thyroid Profile  Bilateral bunions -     Ambulatory referral to Podiatry  Hyperlipidemia associated with type 2 diabetes mellitus ([place]) -     Lipid Panel  Screening for colon cancer -     Fecal occult blood, imunochemical  Other orders -     benzonatate (TESSALON) 100 MG capsule; Take 1 capsule (100 mg total) by mouth 3 (three) times daily as needed for cough. -     insulin glargine (LANTUS) 100 UNIT/ML injection; Inject 0.2 mLs (20 Units total) into the skin at bedtime. -     metFORMIN (GLUCOPHAGE) 1000 MG tablet; Take 1 tablet (1,000 mg total) by mouth 2 (two) times daily with a meal. -     albuterol (PROVENTIL) (2.5 MG/3ML) 0.083% nebulizer solution; Take 3 mLs (2.5 mg total) by nebulization every 6 (six) hours as needed for wheezing or shortness of breath. -     budesonide-formoterol (SYMBICORT) 160-4.5 MCG/ACT inhaler; Inhale 2 puffs into the lungs 2 (two) times daily. -     nystatin (MYCOSTATIN) 100000 UNIT/ML suspension; Take 5 mLs (500,000 Units total) by mouth 4 (four) times daily. Swish, gargle in mouth and expectorate

## 2018-09-29 NOTE — Assessment & Plan Note (Signed)
Diabetes type 2 with poor control and elevated hemoglobin A1c Plan Increase Lantus 20 units at bedtime Increase metformin 1000 mg twice daily Return for recheck with clinical pharmacist in 1 week

## 2018-09-29 NOTE — Assessment & Plan Note (Signed)
Malignant neoplasm left breast status post lumpectomy and chemotherapy in [place] at [place] Referral to [facility] health oncology for follow-up

## 2018-09-29 NOTE — Assessment & Plan Note (Signed)
Oral candidiasis  Plan Begin nystatin mouthwash

## 2018-09-29 NOTE — Assessment & Plan Note (Signed)
Obstructive sleep apnea, not currently using CPAP therapy  Continue oxygen therapy 2 L at bedtime

## 2018-09-29 NOTE — Patient Instructions (Addendum)
Begin Symbicort 2 puffs twice daily Continue amlodipine daily Use nystatin swish gargle and expectorate 4 times daily for oral thrush in the mouth  Refill on the Tessalon was sent to the pharmacy as well to help with cough  Albuterol for use in your nebulizer was prescribed to use 4 times daily as needed  Increase Lantus to 20 units daily Increase metformin to 1000 mg twice daily A return visit with our pharmacist in 1 week will be obtained to go over your diabetes and recheck  We will also have you take a stool card home so we can screen you for colon cancer    A referral to podiatry will be made  A referral to oncology to follow-up on your breast cancer will be made  Labs will be obtained today to include your kidney function cholesterol levels blood counts and a urinalysis  Return to see Dr. Joya Gaskins in follow-up in 2 weeks

## 2018-09-29 NOTE — Assessment & Plan Note (Signed)
Chronic obstructive lung disease with chronic bronchitis Plan Begin Symbicort 2 puffs twice daily Use albuterol as needed by nebulizer

## 2018-09-29 NOTE — Assessment & Plan Note (Signed)
Hyperlipidemia associated with type 2 diabetes  Continue antilipid therapy Follow-up lipid panel

## 2018-09-29 NOTE — Progress Notes (Signed)
Follow up office visit- ED  Blood sugar this am was 586 at the shelter  CBG- 553, 513 A1C -12.2

## 2018-09-29 NOTE — Assessment & Plan Note (Signed)
Hypertension with poor control Plan Continue amlodipine 10 mg daily

## 2018-09-30 LAB — COMPREHENSIVE METABOLIC PANEL
ALBUMIN: 4.6 g/dL (ref 3.5–5.5)
ALT: 11 IU/L (ref 0–32)
AST: 10 IU/L (ref 0–40)
Albumin/Globulin Ratio: 1.6 (ref 1.2–2.2)
Alkaline Phosphatase: 104 IU/L (ref 39–117)
BUN / CREAT RATIO: 17 (ref 9–23)
BUN: 15 mg/dL (ref 6–24)
Bilirubin Total: 0.5 mg/dL (ref 0.0–1.2)
CO2: 25 mmol/L (ref 20–29)
Calcium: 9.6 mg/dL (ref 8.7–10.2)
Chloride: 92 mmol/L — ABNORMAL LOW (ref 96–106)
Creatinine, Ser: 0.89 mg/dL (ref 0.57–1.00)
GFR calc Af Amer: 83 mL/min/{1.73_m2} (ref >59)
GFR calc non Af Amer: 72 mL/min/{1.73_m2} (ref >59)
Globulin, Total: 2.8 g/dL (ref 1.5–4.5)
Glucose: 521 mg/dL (ref 65–99)
Potassium: 4.5 mmol/L (ref 3.5–5.2)
SODIUM: 134 mmol/L (ref 134–144)
TOTAL PROTEIN: 7.4 g/dL (ref 6.0–8.5)

## 2018-09-30 LAB — MICROALBUMIN / CREATININE URINE RATIO
Creatinine, Urine: 36.8 mg/dL
Microalb/Creat Ratio: 55.4 mg/g creat — ABNORMAL HIGH (ref 0.0–30.0)
Microalbumin, Urine: 20.4 ug/mL

## 2018-09-30 LAB — LIPID PANEL
Chol/HDL Ratio: 3.4 ratio (ref 0.0–4.4)
Cholesterol, Total: 268 mg/dL — ABNORMAL HIGH (ref 100–199)
HDL: 79 mg/dL (ref >39)
LDL Calculated: 167 mg/dL — ABNORMAL HIGH (ref 0–99)
Triglycerides: 111 mg/dL (ref 0–149)
VLDL Cholesterol Cal: 22 mg/dL (ref 5–40)

## 2018-09-30 LAB — THYROID PANEL
FREE THYROXINE INDEX: 2.5 (ref 1.2–4.9)
T3 Uptake Ratio: 33 % (ref 24–39)
T4, Total: 7.5 ug/dL (ref 4.5–12.0)

## 2018-10-02 ENCOUNTER — Telehealth: Payer: Self-pay

## 2018-10-02 NOTE — Telephone Encounter (Signed)
Call received from the patient requesting to speak to the [facility] nurse. Patient's call back # [phone].   Secure message sent to Lisette Abu, RN and Nelson Chimes, RN with patient's request

## 2018-10-04 LAB — FECAL OCCULT BLOOD, IMMUNOCHEMICAL: FECAL OCCULT BLD: NEGATIVE

## 2018-10-05 ENCOUNTER — Other Ambulatory Visit: Payer: Self-pay | Admitting: Podiatry

## 2018-10-05 ENCOUNTER — Encounter: Payer: Self-pay | Admitting: Podiatry

## 2018-10-05 ENCOUNTER — Ambulatory Visit (INDEPENDENT_AMBULATORY_CARE_PROVIDER_SITE_OTHER): Payer: Medicaid Other

## 2018-10-05 ENCOUNTER — Ambulatory Visit (INDEPENDENT_AMBULATORY_CARE_PROVIDER_SITE_OTHER): Payer: Medicaid Other | Admitting: Podiatry

## 2018-10-05 VITALS — BP 140/87 | HR 66

## 2018-10-05 DIAGNOSIS — E1149 Type 2 diabetes mellitus with other diabetic neurological complication: Secondary | ICD-10-CM

## 2018-10-05 DIAGNOSIS — E1140 Type 2 diabetes mellitus with diabetic neuropathy, unspecified: Secondary | ICD-10-CM | POA: Diagnosis not present

## 2018-10-05 DIAGNOSIS — M79672 Pain in left foot: Secondary | ICD-10-CM | POA: Diagnosis not present

## 2018-10-05 DIAGNOSIS — Q828 Other specified congenital malformations of skin: Secondary | ICD-10-CM

## 2018-10-05 DIAGNOSIS — L309 Dermatitis, unspecified: Secondary | ICD-10-CM

## 2018-10-05 DIAGNOSIS — M79671 Pain in right foot: Secondary | ICD-10-CM

## 2018-10-05 NOTE — Patient Instructions (Signed)
Diabetes Mellitus and Foot Care  Foot care is an important part of your health, especially when you have diabetes. Diabetes may cause you to have problems because of poor blood flow (circulation) to your feet and legs, which can cause your skin to:   Become thinner and drier.   Break more easily.   Heal more slowly.   Peel and crack.  You may also have nerve damage (neuropathy) in your legs and feet, causing decreased feeling in them. This means that you may not notice minor injuries to your feet that could lead to more serious problems. Noticing and addressing any potential problems early is the best way to prevent future foot problems.  How to care for your feet  Foot hygiene   Wash your feet daily with warm water and mild soap. Do not use hot water. Then, pat your feet and the areas between your toes until they are completely dry. Do not soak your feet as this can dry your skin.   Trim your toenails straight across. Do not dig under them or around the cuticle. File the edges of your nails with an emery board or nail file.   Apply a moisturizing lotion or petroleum jelly to the skin on your feet and to dry, brittle toenails. Use lotion that does not contain alcohol and is unscented. Do not apply lotion between your toes.  Shoes and socks   Wear clean socks or stockings every day. Make sure they are not too tight. Do not wear knee-high stockings since they may decrease blood flow to your legs.   Wear shoes that fit properly and have enough cushioning. Always look in your shoes before you put them on to be sure there are no objects inside.   To break in new shoes, wear them for just a few hours a day. This prevents injuries on your feet.  Wounds, scrapes, corns, and calluses   Check your feet daily for blisters, cuts, bruises, sores, and redness. If you cannot see the bottom of your feet, use a mirror or ask someone for help.   Do not cut corns or calluses or try to remove them with medicine.   If you  find a minor scrape, cut, or break in the skin on your feet, keep it and the skin around it clean and dry. You may clean these areas with mild soap and water. Do not clean the area with peroxide, alcohol, or iodine.   If you have a wound, scrape, corn, or callus on your foot, look at it several times a day to make sure it is healing and not infected. Check for:  ? Redness, swelling, or pain.  ? Fluid or blood.  ? Warmth.  ? Pus or a bad smell.  General instructions   Do not cross your legs. This may decrease blood flow to your feet.   Do not use heating pads or hot water bottles on your feet. They may burn your skin. If you have lost feeling in your feet or legs, you may not know this is happening until it is too late.   Protect your feet from hot and cold by wearing shoes, such as at the beach or on hot pavement.   Schedule a complete foot exam at least once a year (annually) or more often if you have foot problems. If you have foot problems, report any cuts, sores, or bruises to your health care provider immediately.  Contact a health care provider if:     You have a medical condition that increases your risk of infection and you have any cuts, sores, or bruises on your feet.   You have an injury that is not healing.   You have redness on your legs or feet.   You feel burning or tingling in your legs or feet.   You have pain or cramps in your legs and feet.   Your legs or feet are numb.   Your feet always feel cold.   You have pain around a toenail.  Get help right away if:   You have a wound, scrape, corn, or callus on your foot and:  ? You have pain, swelling, or redness that gets worse.  ? You have fluid or blood coming from the wound, scrape, corn, or callus.  ? Your wound, scrape, corn, or callus feels warm to the touch.  ? You have pus or a bad smell coming from the wound, scrape, corn, or callus.  ? You have a fever.  ? You have a red line going up your leg.  Summary   Check your feet every day  for cuts, sores, red spots, swelling, and blisters.   Moisturize feet and legs daily.   Wear shoes that fit properly and have enough cushioning.   If you have foot problems, report any cuts, sores, or bruises to your health care provider immediately.   Schedule a complete foot exam at least once a year (annually) or more often if you have foot problems.  This information is not intended to replace advice given to you by your health care provider. Make sure you discuss any questions you have with your health care provider.  Document Released: 10/01/2000 Document Revised: 11/16/2017 Document Reviewed: 11/05/2016  Elsevier Interactive Patient Education  2019 [employer]

## 2018-10-07 NOTE — Progress Notes (Signed)
Subjective:   Patient ID: Lori Brown, female   DOB: 57 y.o.   MRN: [id number]   HPI Patient presents with poor health with elongated nailbeds and thick lesion formation along with structural bunion deformity bilateral.  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  Patient is not currently smoking   Review of Systems  All other systems reviewed and are negative.       Objective:  Physical Exam Vitals signs and nursing note reviewed.  Constitutional:      Appearance: She is well-developed.  Pulmonary:     Effort: Pulmonary effort is normal.  Musculoskeletal: Normal range of motion.  Skin:    General: Skin is warm.  Neurological:     Mental Status: She is alert.     Vascular status was found to be intact with patient found to have moderate structural bunion deformity and keratotic lesion formation bilateral that is painful when palpated and making walking difficult.  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  Reappoint as symptoms indicated and gave good education on diabetic foot care and daily evaluations of her feet  X-rays indicate moderate structural bunion deformity bilateral with keratotic tissue formation

## 2018-10-12 ENCOUNTER — Ambulatory Visit: Payer: Medicaid Other | Attending: Family Medicine | Admitting: Pharmacist

## 2018-10-12 DIAGNOSIS — E119 Type 2 diabetes mellitus without complications: Secondary | ICD-10-CM | POA: Diagnosis not present

## 2018-10-12 DIAGNOSIS — E1165 Type 2 diabetes mellitus with hyperglycemia: Secondary | ICD-10-CM | POA: Diagnosis not present

## 2018-10-12 DIAGNOSIS — Z23 Encounter for immunization: Secondary | ICD-10-CM

## 2018-10-12 DIAGNOSIS — F17210 Nicotine dependence, cigarettes, uncomplicated: Secondary | ICD-10-CM | POA: Diagnosis not present

## 2018-10-12 LAB — GLUCOSE, POCT (MANUAL RESULT ENTRY): POC Glucose: 294 mg/dl — AB (ref 70–99)

## 2018-10-12 MED ORDER — PNEUMOCOCCAL VAC POLYVALENT 25 MCG/0.5ML IJ INJ
0.5000 mL | INJECTION | Freq: Once | INTRAMUSCULAR | Status: AC
  Administered 2018-10-12: 0.5 mL via INTRAMUSCULAR

## 2018-10-12 MED FILL — SYMBICORT 160-4.5 MCG INH: 160-4.5 | 30 days supply | Qty: 10 | Fill #1

## 2018-10-12 NOTE — Patient Instructions (Signed)
Thank you for coming to see me today. Please do the following:  1. Increase Lantus to 24 units. Increase by 2 units every 2 to 3 days if your morning blood sugars are above 130. 2. Continue metformin 1 tablet 2 times a day. 3. Continue checking blood sugars at home.  4. Continue making the lifestyle changes we've discussed together during our visit. Diet and exercise play a significant role in improving your blood sugars.  5. Follow-up with me in 2 weeks.   Hypoglycemia or low blood sugar:   Low blood sugar can happen quickly and may become an emergency if not treated right away.   While this shouldn't happen often, it can be brought upon if you skip a meal or do not eat enough. Also, if your insulin or other diabetes medications are dosed too high, this can cause your blood sugar to go to low.   Warning signs of low blood sugar include: 1. Feeling shaky or dizzy 2. Feeling weak or tired  3. Excessive hunger 4. Feeling anxious or upset  5. Sweating even when you aren't exercising  What to do if I experience low blood sugar? 1. Check your blood sugar with your meter. If lower than 70, proceed to step 2.  2. Treat with 3-4 glucose tablets or 3 packets of regular sugar. If these aren't around, you can try hard candy. Yet another option would be to drink 4 ounces of fruit juice or 6 ounces of REGULAR soda.  3. Re-check your sugar in 15 minutes. If it is still below 70, do what you did in step 2 again. If has come back up, go ahead and eat a snack or small meal at this time.

## 2018-10-12 NOTE — Progress Notes (Signed)
    S:    PCP: Dr. Joya Gaskins No chief complaint on file.  Patient arrives in good spirits. Presents for diabetes management at the request of Dr. Joya Gaskins. Patient was referred on 09/29/18. At that visit, patient's metformin and Lantus were increased to 1 g BID and 20 units daily respectively.   Family/Social History:  - No pertinent family history - Current every day smoker (0.1 PPD) - Drinks alcohol "rarely"  Insurance coverage/medication affordability:  - [employer]  Patient reports adherence with medications.  Current diabetes medications include: Lantus 20 units daily, metformin 1000 mg BID  Patient denies hypoglycemic events.  Patient reported dietary habits:  - Pt admits to consuming sweets and soda daily - Does not limit carbohydrates   Patient-reported exercise habits:  - Exercises at the [facility] ~1 hours, 4 days out of the week   Patient reports polyuria, polydipsia, and polyphagia.  Patient denies neuropathy. Patient reports visual changes. Patient reports self foot exams.   O:  POCT glucose: 294 Home fasting glucose: 150s - low 200s Home post-prandial/random glucose: 200s  Lab Results  Component Value Date   HGBA1C 12.2 (A) 09/29/2018   There were no vitals filed for this visit.  Lipid Panel     Component Value Date/Time   CHOL 268 (H) 09/29/2018 1241   TRIG 111 09/29/2018 1241   HDL 79 09/29/2018 1241   CHOLHDL 3.4 09/29/2018 1241   LDLCALC 167 (H) 09/29/2018 1241   Clinical ASCVD: No  The 10-year ASCVD risk score Mikey Bussing DC Jr., et al., 2013) is: 32.6%   Values used to calculate the score:     Age: 57 years     Sex: Female     Is Non-Hispanic African American: Yes     Diabetic: Yes     Tobacco smoker: Yes     Systolic Blood Pressure: 062 mmHg     Is BP treated: Yes     HDL Cholesterol: 79 mg/dL     Total Cholesterol: 268 mg/dL    A/P: Diabetes longstanding currently uncontrolled. Patient is able to verbalize appropriate hypoglycemia management plan.  Patient is adherent with medication. Control is suboptimal due to dietary indiscretion.   Compelling indications: Pt with no clinical ASCVD, HF or CKD. Pt with continued symptoms of hyperglycemia with A1c 12.2. Will continue with Lantus and have pt titrate to achieve fasting goal before adding additional agent for DM.  -Increased dose of Lantus to 24 units daily. Patient will continue to titrate 2 units every 3 days if fasting CBGs > 130mg /dl until fasting CBGs reach goal or next visit. Max 30 units. -Continued metformin 1000 mg BID -Extensively discussed pathophysiology of DM, recommended lifestyle interventions, dietary effects on glycemic control -Counseled on s/sx of and management of hypoglycemia -Next A1C anticipated 12/2018.   ASCVD risk - primary prevention in patient with DM. Last LDL is not controlled. ASCVD risk score is >20%  - high intensity statin indicated.  -Will address intensification of statin at next visit   HM: influenza, tetanus and PNA vaccines due.  - Fluarix, Pneumovax-23 given - Will address tetanus at next visit  Written patient instructions provided. Total time in face to face counseling 30 minutes.   Follow up Pharmacist Clinic Visit in 2 weeks.    Benard Halsted, PharmD, [place] [phone]

## 2018-10-13 ENCOUNTER — Encounter: Payer: Self-pay | Admitting: Pharmacist

## 2018-10-23 ENCOUNTER — Encounter: Payer: Self-pay | Admitting: Allergy

## 2018-10-23 ENCOUNTER — Ambulatory Visit (INDEPENDENT_AMBULATORY_CARE_PROVIDER_SITE_OTHER): Payer: Medicaid Other | Admitting: Allergy

## 2018-10-23 VITALS — BP 140/90 | HR 86 | Temp 99.0°F | Resp 20 | Ht 66.5 in | Wt 241.6 lb

## 2018-10-23 DIAGNOSIS — Z91013 Allergy to seafood: Secondary | ICD-10-CM | POA: Insufficient documentation

## 2018-10-23 DIAGNOSIS — Z720 Tobacco use: Secondary | ICD-10-CM | POA: Insufficient documentation

## 2018-10-23 DIAGNOSIS — J449 Chronic obstructive pulmonary disease, unspecified: Secondary | ICD-10-CM | POA: Insufficient documentation

## 2018-10-23 DIAGNOSIS — J3089 Other allergic rhinitis: Secondary | ICD-10-CM | POA: Insufficient documentation

## 2018-10-23 MED ORDER — MONTELUKAST SODIUM 10 MG PO TABS: 10.0000 mg | ORAL_TABLET | Freq: Every day | ORAL | 5 refills | Status: DC

## 2018-10-23 MED ORDER — EPINEPHRINE 0.3 MG/0.3ML IJ SOAJ: INTRAMUSCULAR | 2 refills | Status: DC

## 2018-10-23 NOTE — Patient Instructions (Addendum)
Today's testing showed: Positive to trees, ragweed, weed, grass, mold, dust mites, cockroaches, cat, dog  Positive to shellfish. I have prescribed epinephrine injectable and demonstrated proper use. For mild symptoms you can take over the counter antihistamines such as Benadryl and monitor symptoms closely. If symptoms worsen or if you have severe symptoms including breathing issues, throat closure, significant swelling, whole body hives, severe diarrhea and vomiting, lightheadedness then inject epinephrine and seek immediate medical care afterwards.  Asthma/COPD . Daily controller medication(s): Symbicort 160 2 puffs twice a day with spacer and rinse mouth afterwards. Demonstrated proper use.  o Start Singulair 10mg  daily.  . Prior to physical activity: May use albuterol rescue inhaler 2 puffs 5 to 15 minutes prior to strenuous physical activities. Marland Kitchen Rescue medications: May use albuterol rescue inhaler 2 puffs or nebulizer every 4 to 6 hours as needed for shortness of breath, chest tightness, coughing, and wheezing. Monitor frequency of use.  Marland Kitchen Stop smoking . Asthma control goals:  o Full participation in all desired activities (may need albuterol before activity) o Albuterol use two times or less a week on average (not counting use with activity) o Cough interfering with sleep two times or less a month o Oral steroids no more than once a year o No hospitalizations  Follow up in 1 month  Control of House Dust Mite Allergen . Dust mite allergens are a common trigger of allergy and asthma symptoms. While they can be found throughout the house, these microscopic creatures thrive in warm, humid environments such as bedding, upholstered furniture and carpeting. . Because so much time is spent in the bedroom, it is essential to reduce mite levels there.  . Encase pillows, mattresses, and box springs in special allergen-proof fabric covers or airtight, zippered plastic covers.  . Bedding should be  washed weekly in hot water (130 F) and dried in a hot dryer. Allergen-proof covers are available for comforters and pillows that can't be regularly washed.  Wendee Copp the allergy-proof covers every few months. Minimize clutter in the bedroom. Keep pets out of the bedroom.  Marland Kitchen Keep humidity less than 50% by using a dehumidifier or air conditioning. You can buy a humidity measuring device called a hygrometer to monitor this.  . If possible, replace carpets with hardwood, linoleum, or washable area rugs. If that's not possible, vacuum frequently with a vacuum that has a HEPA filter. . Remove all upholstered furniture and non-washable window drapes from the bedroom. . Remove all non-washable stuffed toys from the bedroom.  Wash stuffed toys weekly. Cockroach Allergen Avoidance Cockroaches are often found in the homes of densely populated urban areas, schools or commercial buildings, but these creatures can lurk almost anywhere. This does not mean that you have a dirty house or living area. . Block all areas where roaches can enter the home. This includes crevices, wall cracks and windows.  . Cockroaches need water to survive, so fix and seal all leaky faucets and pipes. Have an exterminator go through the house when your family and pets are gone to eliminate any remaining roaches. Marland Kitchen Keep food in lidded containers and put pet food dishes away after your pets are done eating. Vacuum and sweep the floor after meals, and take out garbage and recyclables. Use lidded garbage containers in the kitchen. Wash dishes immediately after use and clean under stoves, refrigerators or toasters where crumbs can accumulate. Wipe off the stove and other kitchen surfaces and cupboards regularly. Reducing Pollen Exposure . Pollen seasons: trees (  spring), grass (summer) and ragweed/weeds (fall). Marland Kitchen Keep windows closed in your home and car to lower pollen exposure.  Susa Simmonds air conditioning in the bedroom and throughout the house  if possible.  . Avoid going out in dry windy days - especially early morning. . Pollen counts are highest between 5 - 10 AM and on dry, hot and windy days.  . Save outside activities for late afternoon or after a heavy rain, when pollen levels are lower.  . Avoid mowing of grass if you have grass pollen allergy. Marland Kitchen Be aware that pollen can also be transported indoors on people and pets.  . Dry your clothes in an automatic dryer rather than hanging them outside where they might collect pollen.  . Rinse hair and eyes before bedtime. Mold Control . Mold and fungi can grow on a variety of surfaces provided certain temperature and moisture conditions exist.  . Outdoor molds grow on plants, decaying vegetation and soil. The major outdoor mold, Alternaria and Cladosporium, are found in very high numbers during hot and dry conditions. Generally, a late summer - fall peak is seen for common outdoor fungal spores. Rain will temporarily lower outdoor mold spore count, but counts rise rapidly when the rainy period ends. . The most important indoor molds are Aspergillus and Penicillium. Dark, humid and poorly ventilated basements are ideal sites for mold growth. The next most common sites of mold growth are the bathroom and the kitchen. Outdoor (Seasonal) Mold Control . Use air conditioning and keep windows closed. . Avoid exposure to decaying vegetation. Marland Kitchen Avoid leaf raking. . Avoid grain handling. . Consider wearing a face mask if working in moldy areas.  Indoor (Perennial) Mold Control  . Maintain humidity below 50%. . Get rid of mold growth on hard surfaces with water, detergent and, if necessary, 5% bleach (do not mix with other cleaners). Then dry the area completely. If mold covers an area more than 10 square feet, consider hiring an indoor environmental professional. . For clothing, washing with soap and water is best. If moldy items cannot be cleaned and dried, throw them away. . Remove sources e.g.  contaminated carpets. . Repair and seal leaking roofs or pipes. Using dehumidifiers in damp basements may be helpful, but empty the water and clean units regularly to prevent mildew from forming. All rooms, especially basements, bathrooms and kitchens, require ventilation and cleaning to deter mold and mildew growth. Avoid carpeting on concrete or damp floors, and storing items in damp areas.  Pet Allergen Avoidance: . Contrary to popular opinion, there are no "hypoallergenic" breeds of dogs or cats. That is because people are not allergic to an animal's hair, but to an allergen found in the animal's saliva, dander (dead skin flakes) or urine. Pet allergy symptoms typically occur within minutes. For some people, symptoms can build up and become most severe 8 to 12 hours after contact with the animal. People with severe allergies can experience reactions in public places if dander has been transported on the pet owners' clothing. Marland Kitchen Keeping an animal outdoors is only a partial solution, since homes with pets in the yard still have higher concentrations of animal allergens. . Before getting a pet, ask your allergist to determine if you are allergic to animals. If your pet is already considered part of your family, try to minimize contact and keep the pet out of the bedroom and other rooms where you spend a great deal of time. . As with dust mites, vacuum  carpets often or replace carpet with a hardwood floor, tile or linoleum. . High-efficiency particulate air (HEPA) cleaners can reduce allergen levels over time. . While dander and saliva are the source of cat and dog allergens, urine is the source of allergens from rabbits, hamsters, mice and [place] pigs; so ask a non-allergic family member to clean the animal's cage. . If you have a pet allergy, talk to your allergist about the potential for allergy immunotherapy (allergy shots). This strategy can often provide long-term relief.

## 2018-10-23 NOTE — Assessment & Plan Note (Addendum)
Diagnosed with asthma over 30 years ago but worsening symptoms the last 3 years.  Currently on Symbicort 160 2 puffs twice a day.  As needed with some benefit.  One course of oral prednisone the last year but had 4 ER/urgent care visits.  Today's spirometry showed 20% improvement in FEV1 post bronchodilator treatment. . Daily controller medication(s): Symbicort 160 2 puffs twice a day with spacer and rinse mouth afterwards. Demonstrated proper use and spacer given.  o Start Singulair 10mg  daily.  . Prior to physical activity: May use albuterol rescue inhaler 2 puffs 5 to 15 minutes prior to strenuous physical activities. Marland Kitchen Rescue medications: May use albuterol rescue inhaler 2 puffs or nebulizer every 4 to 6 hours as needed for shortness of breath, chest tightness, coughing, and wheezing. Monitor frequency of use.  . Discussed smoking cessation. . If above regimen does not control symptoms then will consider adding Spiriva and/or biologic treatment.

## 2018-10-23 NOTE — Progress Notes (Signed)
New Patient Note  RE: Lori Brown MRN: [id number] DOB: 08-21-1961 Date of Office Visit: 10/23/2018  Referring provider: No ref. provider found Primary care provider: Patient, No Pcp Per  Chief Complaint: New Patient (Initial Visit) (asthma has been flared and has COPD)  History of Present Illness: I had the pleasure of seeing Lori Brown for initial evaluation at the [facility] on 10/23/2018. She is a 58 y.o. female, who is self-referred here for the evaluation of asthma/copd.   Asthma/COPD: Patient moved from [place] to [place] in October 2019. Symptoms are slightly better in [place].  She reports symptoms of chest tightness, shortness of breath, coughing, wheezing, nocturnal awakenings for 30 years but worsening the past 3 years. Current medications include Symbicort 160 2 puffs BID and albuterol prn which help. She reports not using aerochamber with asthma inhalers. She tried the following inhalers: Advair. Main asthma triggers are smoke, exercise, strong scents. In the last month, frequency of asthma symptoms: 3x/week. Frequency of nocturnal symptoms: 2x/week. Frequency of SABA use: twice a day. Interference with physical activity: yes. Sleep is disturbed. In the last 12 months, emergency room visits/urgent care visits/doctor office visits or hospitalizations due to asthma: 4 times. In the last 12 months, oral steroids courses: one.  Lifetime history of hospitalization for asthma: no. Prior intubations: no. Asthma was diagnosed at age 87. History of pneumonia: no. She was not evaluated by allergist/pulmonologist in the past. Smoking exposure: 1 cig/day x 70yrs. Up to date with flu vaccine: yes. Up to date with pneumonia vaccine: yes. Denies any significant rhino conjunctivitis symptoms.   Assessment and Plan: Lori Brown is a 58 y.o. female with: COPD with asthma (Lori Brown) Diagnosed with asthma over 30 years ago but worsening symptoms the last 3 years.  Currently on Symbicort 160  2 puffs twice a day.  As needed with some benefit.  One course of oral prednisone the last year but had 4 ER/urgent care visits.  Today's spirometry showed 20% improvement in FEV1 post bronchodilator treatment. . Daily controller medication(s): Symbicort 160 2 puffs twice a day with spacer and rinse mouth afterwards. Demonstrated proper use and spacer given.  o Start Singulair 10mg  daily.  . Prior to physical activity: May use albuterol rescue inhaler 2 puffs 5 to 15 minutes prior to strenuous physical activities. Marland Kitchen Rescue medications: May use albuterol rescue inhaler 2 puffs or nebulizer every 4 to 6 hours as needed for shortness of breath, chest tightness, coughing, and wheezing. Monitor frequency of use.  . Discussed smoking cessation. . If above regimen does not control symptoms then will consider adding Spiriva and/or biologic treatment.  Tobacco user Discussed smoking cessation.  Other allergic rhinitis Denies any significant rhinoconjunctivitis symptoms however today's skin testing showed multiple positives including trees, ragweed, weed, grass, mold, dust mites, cockroaches, cat, dog.   Discussed environmental control measures.  Monitor symptoms. Start on Singulair 10mg  daily.   Shellfish allergy Shrimp causes throat pruritus but takes Benadryl beforehand and tolerates ingestion.  Today's skin testing was significantly positive to shellfish mix and borderline positive to soy.  Start to avoid shellfish. Soy is most likely and irrelevant sensitization.  I have prescribed epinephrine injectable and demonstrated proper use. For mild symptoms you can take over the counter antihistamines such as Benadryl and monitor symptoms closely. If symptoms worsen or if you have severe symptoms including breathing issues, throat closure, significant swelling, whole body hives, severe diarrhea and vomiting, lightheadedness then inject epinephrine and seek immediate medical  care afterwards.  Return  in about 4 weeks (around 11/20/2018).  Meds ordered this encounter  Medications  . EPINEPHrine 0.3 mg/0.3 mL IJ SOAJ injection    Sig: Use as directed for severe allergic reaction    Dispense:  2 Device    Refill:  2  . montelukast (SINGULAIR) 10 MG tablet    Sig: Take 1 tablet (10 mg total) by mouth at bedtime.    Dispense:  30 tablet    Refill:  5   Other allergy screening: Asthma: yes Rhino conjunctivitis: no Food allergy: yes  Shrimp causes some throat pruritus but takes benadryl prior to eating and does okay.  Medication allergy: no Hymenoptera allergy: no Urticaria: no Eczema:no History of recurrent infections suggestive of immunodeficency: no  Diagnostics: Spirometry:  Tracings reviewed. Her effort: It was hard to get consistent efforts and there is a question as to whether this reflects a maximal maneuver. FVC: 1.94L FEV1: 1.61L, 67% predicted FEV1/FVC ratio: 83% Interpretation: 20% improvement in FEV1 post bronchodilator treatment.  Please see scanned spirometry results for details.  Skin Testing: Environmental allergy panel and select foods. Positive test to: trees, ragweed, weed, grass, mold, dust mites, cockroaches, cat, dog, shellfish. Results discussed with patient/family. Airborne Adult Perc - 10/23/18 1414    Time Antigen Placed  1412    Allergen Manufacturer  Lavella Hammock    Location  Back    Number of Test  59    Panel 1  Select    1. Control-Buffer 50% Glycerol  Negative    2. Control-Histamine 1 mg/ml  2+    3. Albumin saline  Negative    4. [place]  Negative    5. Guatemala  Negative    6. Johnson  Negative    7. Sedalia Blue  Negative    8. Meadow Fescue  Negative    9. Perennial Rye  Negative    10. Sweet Vernal  Negative    11. Timothy  Negative    12. Cocklebur  Negative    13. Burweed Marshelder  Negative    14. Ragweed, short  3+    15. Ragweed, Giant  Negative    16. Plantain,  English  Negative    17. Lamb's Quarters  Negative    18. Sheep  Sorrell  Negative    19. Rough Pigweed  Negative    20. Marsh Elder, Rough  3+    21. Mugwort, Common  Negative    22. Ash mix  Negative    23. Birch mix  Negative    24. Beech American  Negative    25. Box, Elder  Negative    26. Cedar, red  Negative    27. Cottonwood, Eastern  3+    28. Elm mix  Negative    29. Hickory mix  Negative    30. Maple mix  4+    31. Oak, Russian Federation mix  Negative    32. Pecan Pollen  Negative    33. Pine mix  Negative    34. Sycamore Eastern  Negative    35. Bassett, Black Pollen  3+    36. Alternaria alternata  Negative    37. Cladosporium Herbarum  Negative    38. Aspergillus mix  Negative    39. Penicillium mix  Negative    40. Bipolaris sorokiniana (Helminthosporium)  Negative    41. Drechslera spicifera (Curvularia)  Negative    42. Mucor plumbeus  Negative    43. Fusarium moniliforme  Negative    44. Aureobasidium pullulans (pullulara)  Negative    45. Rhizopus oryzae  Negative    46. Botrytis cinera  Negative    47. Epicoccum nigrum  Negative    48. Phoma betae  Negative    49. Candida Albicans  3+    50. Trichophyton mentagrophytes  3+    51. Mite, D Farinae  5,000 AU/ml  4+    52. Mite, D Pteronyssinus  5,000 AU/ml  4+    53. Cat Hair 10,000 BAU/ml  3+    54.  Dog Epithelia  Negative    55. Mixed Feathers  Negative    56. Horse Epithelia  Negative    57. Cockroach, German  4+    58. Mouse  Negative    59. Tobacco Leaf  Negative     Food Perc - 10/23/18 1414    Time Antigen Placed  1412    Allergen Manufacturer  Lavella Hammock    Location  Back    Number of allergen test  10    Food  Select    1. Peanut  Negative    2. Soybean food  2+   3x3   3. Wheat, whole  Negative    4. Sesame  Negative    5. Milk, cow  Negative    6. Egg White, chicken  Negative    7. Casein  Negative    8. Shellfish mix  4+   18x5   9. Fish mix  Negative    10. Cashew  Negative     Intradermal - 10/23/18 1454    Allergen Manufacturer  Lavella Hammock    Location   Arm    Number of Test  10    Intradermal  Select    Control  Negative    Guatemala  2+    Johnson  2+    7 Grass  2+    Weed mix  4+    Mold 1  Negative    Mold 2  2+    Mold 3  Negative    Mold 4  Negative    Dog  3+       Past Medical History: Patient Active Problem List   Diagnosis Date Noted  . Other allergic rhinitis 10/23/2018  . COPD with asthma ([place]) 10/23/2018  . Shellfish allergy 10/23/2018  . Tobacco user 10/23/2018  . 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 ([place]) 09/29/2018  . Oral candidiasis 09/29/2018  . COPD with chronic bronchitis ([place]) 09/29/2018  . DM (diabetes mellitus), type 2, uncontrolled ([place]) 09/29/2018  . OSA (obstructive sleep apnea) 09/29/2018  . HTN (hypertension) 09/29/2018  . Hyperlipidemia associated with type 2 diabetes mellitus ([place]) 09/29/2018  . Bilateral bunions 09/29/2018  . Screening for colon cancer 09/29/2018   Past Medical History:  Diagnosis Date  . Asthma   . COPD (chronic obstructive pulmonary disease) ([place])   . Diabetes mellitus without complication ([place])   . Hypertension    Past Surgical History: Past Surgical History:  Procedure Laterality Date  . PARTIAL HYSTERECTOMY     Medication List:  Current Outpatient Medications  Medication Sig Dispense Refill  . albuterol (PROVENTIL) (2.5 MG/3ML) 0.083% nebulizer solution Take 3 mLs (2.5 mg total) by nebulization every 6 (six) hours as needed for wheezing or shortness of breath. 75 mL 12  . amLODipine (NORVASC) 10 MG tablet Take 1 tablet (10 mg total) by mouth daily. 30 tablet 0  . budesonide-formoterol (SYMBICORT) 160-4.5  MCG/ACT inhaler Inhale 2 puffs into the lungs 2 (two) times daily. 1 Inhaler 12  . insulin glargine (LANTUS) 100 UNIT/ML injection Inject 0.2 mLs (20 Units total) into the skin at bedtime. 10 mL 6  . metFORMIN (GLUCOPHAGE) 1000 MG tablet Take 1 tablet (1,000 mg total) by mouth 2 (two) times daily with a meal. 60 tablet 6  .  sertraline (ZOLOFT) 100 MG tablet Take 100 mg by mouth daily.    . simvastatin (ZOCOR) 20 MG tablet Take 20 mg by mouth daily.  0  . traZODone (DESYREL) 50 MG tablet Take 50 mg by mouth at bedtime.    . benzonatate (TESSALON) 100 MG capsule Take 1 capsule (100 mg total) by mouth 3 (three) times daily as needed for cough. (Patient not taking: Reported on 10/23/2018) 60 capsule 0  . EPINEPHrine 0.3 mg/0.3 mL IJ SOAJ injection Use as directed for severe allergic reaction 2 Device 2  . montelukast (SINGULAIR) 10 MG tablet Take 1 tablet (10 mg total) by mouth at bedtime. 30 tablet 5  . nystatin (MYCOSTATIN) 100000 UNIT/ML suspension Take 5 mLs (500,000 Units total) by mouth 4 (four) times daily. Swish, gargle in mouth and expectorate (Patient not taking: Reported on 10/23/2018) 60 mL 0   No current facility-administered medications for this visit.    Allergies: No Known Allergies Social History: Social History   Socioeconomic History  . Marital status: Single    Spouse name: Not on file  . Number of children: Not on file  . Years of education: Not on file  . Highest education level: Not on file  Occupational History  . Not on file  Social Needs  . Financial resource strain: Not on file  . Food insecurity:    Worry: Not on file    Inability: Not on file  . Transportation needs:    Medical: Not on file    Non-medical: Not on file  Tobacco Use  . Smoking status: Current Every Day Smoker    Packs/day: 0.10    Types: Cigarettes  . Smokeless tobacco: Never Used  Substance and Sexual Activity  . Alcohol use: Yes    Comment: rarely   . Drug use: Not Currently  . Sexual activity: Not on file  Lifestyle  . Physical activity:    Days per week: Not on file    Minutes per session: Not on file  . Stress: Not on file  Relationships  . Social connections:    Talks on phone: Not on file    Gets together: Not on file    Attends religious service: Not on file    Active member of club or  organization: Not on file    Attends meetings of clubs or organizations: Not on file    Relationship status: Not on file  Other Topics Concern  . Not on file  Social History Narrative  . Not on file   Lives in a homeless shelter. Smoking: currently smokes 1 cig/day,smoking for 40+ years Occupation: not employed  Environmental HistoryFreight forwarder in the house: no Charity fundraiser in the family room: no Carpet in the bedroom: no Heating: electric Cooling: unsure Pet: no  Family History: History reviewed. No pertinent family history. Problem                               Relation Asthma  Father's side Eczema                                No  Food allergy                          No  Allergic rhino conjunctivitis     No   Review of Systems  Constitutional: Negative for appetite change, chills, fever and unexpected weight change.  HENT: Negative for congestion and rhinorrhea.   Eyes: Negative for itching.  Respiratory: Positive for cough, chest tightness, shortness of breath and wheezing.   Cardiovascular: Negative for chest pain.  Gastrointestinal: Negative for abdominal pain.  Genitourinary: Negative for difficulty urinating.  Skin: Negative for rash.  Allergic/Immunologic: Positive for food allergies. Negative for environmental allergies.  Neurological: Negative for headaches.   Objective: BP 140/90   Pulse 86   Temp 99 F (37.2 C) (Oral)   Resp 20   Ht 5' 6.5" (1.689 m)   Wt 241 lb 9.6 oz (109.6 kg)   SpO2 96%   BMI 38.41 kg/m  Body mass index is 38.41 kg/m. Physical Exam  Constitutional: She is oriented to person, place, and time. She appears well-developed and well-nourished.  HENT:  Head: Normocephalic and atraumatic.  Right Ear: External ear normal.  Left Ear: External ear normal.  Nose: Nose normal.  Mouth/Throat: Oropharynx is clear and moist.  Eyes: Conjunctivae and EOM are normal.  Neck: Neck supple.    Cardiovascular: Normal rate, regular rhythm and normal heart sounds. Exam reveals no gallop and no friction rub.  No murmur heard. Pulmonary/Chest: Effort normal and breath sounds normal. She has no wheezes. She has no rales.  Abdominal: Soft. Bowel sounds are normal. There is no abdominal tenderness.  Lymphadenopathy:    She has no cervical adenopathy.  Neurological: She is alert and oriented to person, place, and time.  Skin: Skin is warm. No rash noted.  Psychiatric: She has a normal mood and affect. Her behavior is normal.  Nursing note and vitals reviewed.  The plan was reviewed with the patient/family, and all questions/concerned were addressed.  It was my pleasure to see Lori Brown today and participate in her care. Please feel free to contact me with any questions or concerns.  Sincerely,  Rexene Alberts, DO Allergy & Immunology  [facility] office: [phone] [facility] office: [phone]  60 minutes spent face-to-face with more than 50% of the time spent discussing asthma, food and environmental allergies.

## 2018-10-23 NOTE — Assessment & Plan Note (Signed)
   Discussed smoking cessation. 

## 2018-10-23 NOTE — Assessment & Plan Note (Addendum)
Denies any significant rhinoconjunctivitis symptoms however today's skin testing showed multiple positives including trees, ragweed, weed, grass, mold, dust mites, cockroaches, cat, dog.   Discussed environmental control measures.  Monitor symptoms. Start on Singulair 10mg  daily.

## 2018-10-23 NOTE — Assessment & Plan Note (Signed)
Shrimp causes throat pruritus but takes Benadryl beforehand and tolerates ingestion.  Today's skin testing was significantly positive to shellfish mix and borderline positive to soy.  Start to avoid shellfish. Soy is most likely and irrelevant sensitization.  I have prescribed epinephrine injectable and demonstrated proper use. For mild symptoms you can take over the counter antihistamines such as Benadryl and monitor symptoms closely. If symptoms worsen or if you have severe symptoms including breathing issues, throat closure, significant swelling, whole body hives, severe diarrhea and vomiting, lightheadedness then inject epinephrine and seek immediate medical care afterwards.

## 2018-10-24 ENCOUNTER — Ambulatory Visit: Payer: 59 | Attending: Critical Care Medicine | Admitting: Pharmacist

## 2018-10-24 ENCOUNTER — Encounter: Payer: Self-pay | Admitting: Pharmacist

## 2018-10-24 DIAGNOSIS — F17210 Nicotine dependence, cigarettes, uncomplicated: Secondary | ICD-10-CM | POA: Insufficient documentation

## 2018-10-24 DIAGNOSIS — Z794 Long term (current) use of insulin: Secondary | ICD-10-CM | POA: Insufficient documentation

## 2018-10-24 DIAGNOSIS — E1165 Type 2 diabetes mellitus with hyperglycemia: Secondary | ICD-10-CM | POA: Diagnosis not present

## 2018-10-24 LAB — GLUCOSE, POCT (MANUAL RESULT ENTRY): POC Glucose: 130 mg/dl — AB (ref 70–99)

## 2018-10-24 MED ORDER — INSULIN GLARGINE 100 UNIT/ML ~~LOC~~ SOLN: 25.0000 [IU] | Freq: Every day | SUBCUTANEOUS | 2 refills | Status: DC

## 2018-10-24 MED ORDER — ATORVASTATIN CALCIUM 40 MG PO TABS: 40.0000 mg | ORAL_TABLET | Freq: Every day | ORAL | 2 refills | Status: DC

## 2018-10-24 MED ORDER — GLUCOSE BLOOD VI STRP: ORAL_STRIP | 11 refills | Status: DC

## 2018-10-24 MED ORDER — ONETOUCH DELICA LANCETS 33G MISC: 11 refills | Status: AC

## 2018-10-24 NOTE — Patient Instructions (Signed)
Thank you for coming to see me today. Please do the following:  1. Continue taking Lantus 25 units and metformin twice a day. 2. Stop simvastatin and start atorvastatin.  3. Continue checking blood sugars at home. 4. Continue making the lifestyle changes we've discussed together during our visit. Diet and exercise play a significant role in improving your blood sugars.  5. Follow-up with your doctor in March.   Hypoglycemia or low blood sugar:   Low blood sugar can happen quickly and may become an emergency if not treated right away.   While this shouldn't happen often, it can be brought upon if you skip a meal or do not eat enough. Also, if your insulin or other diabetes medications are dosed too high, this can cause your blood sugar to go to low.   Warning signs of low blood sugar include: 1. Feeling shaky or dizzy 2. Feeling weak or tired  3. Excessive hunger 4. Feeling anxious or upset  5. Sweating even when you aren't exercising  What to do if I experience low blood sugar? 1. Check your blood sugar with your meter. If lower than 70, proceed to step 2.  2. Treat with 3-4 glucose tablets or 3 packets of regular sugar. If these aren't around, you can try hard candy. Yet another option would be to drink 4 ounces of fruit juice or 6 ounces of REGULAR soda.  3. Re-check your sugar in 15 minutes. If it is still below 70, do what you did in step 2 again. If has come back up, go ahead and eat a snack or small meal at this time.   4

## 2018-10-24 NOTE — Progress Notes (Signed)
    S:    PCP: Dr. Joya Gaskins No chief complaint on file.  Patient arrives in good spirits. Presents for diabetes management at the request of Dr. Joya Gaskins. Patient was referred on 09/29/18. I last saw her on 10/12/18 and increased her Lantus.   Family/Social History:  - No pertinent family history - Current every day smoker (0.1 PPD) - Drinks alcohol "rarely"  Insurance coverage/medication affordability:  - AARP  Patient reports adherence with medications.  Current diabetes medications include: Lantus 25 units daily, metformin 1000 mg BID  Patient denies hypoglycemic events.  Patient reported dietary habits:  - Pt reveals that she has stopped regular soda and started "Zero" products  - No sweets since last visit - Reports decreasing amount of carbohydrates consumed in a day  Patient-reported exercise habits:  - Exercises at the [facility] ~1 hours, 4 days out of the week   Patient denies polyuria, polydipsia, and polyphagia.  Patient denies neuropathy. Patient reports visual changes. Patient reports self foot exams.   O:  POCT glucose: 130 Home fasting glucose: 107 - 143 (1 outlier of 168) Home post-prandial/random glucose: 116 - 184  Lab Results  Component Value Date   HGBA1C 12.2 (A) 09/29/2018   There were no vitals filed for this visit.  Lipid Panel     Component Value Date/Time   CHOL 268 (H) 09/29/2018 1241   TRIG 111 09/29/2018 1241   HDL 79 09/29/2018 1241   CHOLHDL 3.4 09/29/2018 1241   LDLCALC 167 (H) 09/29/2018 1241   Clinical ASCVD: No  The 10-year ASCVD risk score Mikey Bussing DC Jr., et al., 2013) is: 32.6%   Values used to calculate the score:     Age: 58 years     Sex: Female     Is Non-Hispanic African American: Yes     Diabetic: Yes     Tobacco smoker: Yes     Systolic Blood Pressure: 725 mmHg     Is BP treated: Yes     HDL Cholesterol: 79 mg/dL     Total Cholesterol: 268 mg/dL    A/P: Diabetes longstanding currently uncontrolled. Patient is able  to verbalize appropriate hypoglycemia management plan. Patient is adherent with medication.   Pt's blood sugars have improved overall. Will have her continue to titrate Lantus to a max of 30 units to achieve consistent fasting levels < 130.   -Continue Lantus 25 units daily. Patient will continue to titrate 2 units every 3 days if fasting CBGs > 130mg /dl until fasting CBGs reach goal or next visit. Max 30 units. -Continued metformin 1000 mg BID -Extensively discussed pathophysiology of DM, recommended lifestyle interventions, dietary effects on glycemic control -Counseled on s/sx of and management of hypoglycemia -Next A1C anticipated 12/2018.   ASCVD risk - primary prevention in patient with DM. Last LDL is not controlled. ASCVD risk score is >20%  - high intensity statin indicated. LFTs from 09/29/18 WNL.  - Stop simvastatin and start atorvastatin 40 mg daily.   Written patient instructions provided. Total time in face to face counseling 30 minutes.   Follow up w/ PCP 12/19/2018.    Benard Halsted, PharmD, [place] [phone]

## 2018-11-20 ENCOUNTER — Ambulatory Visit: Payer: Medicaid Other | Admitting: Allergy

## 2018-11-30 ENCOUNTER — Ambulatory Visit: Payer: Medicaid Other | Admitting: Allergy

## 2018-11-30 DIAGNOSIS — J301 Allergic rhinitis due to pollen: Secondary | ICD-10-CM

## 2018-12-19 ENCOUNTER — Encounter: Payer: Self-pay | Admitting: Critical Care Medicine

## 2018-12-19 ENCOUNTER — Ambulatory Visit: Payer: 59 | Attending: Critical Care Medicine | Admitting: Critical Care Medicine

## 2018-12-19 ENCOUNTER — Other Ambulatory Visit: Payer: Self-pay

## 2018-12-19 VITALS — BP 146/92 | HR 79 | Temp 98.4°F | Resp 16 | Ht 69.0 in | Wt 245.0 lb

## 2018-12-19 DIAGNOSIS — I10 Essential (primary) hypertension: Secondary | ICD-10-CM | POA: Diagnosis not present

## 2018-12-19 DIAGNOSIS — J449 Chronic obstructive pulmonary disease, unspecified: Secondary | ICD-10-CM

## 2018-12-19 DIAGNOSIS — Z720 Tobacco use: Secondary | ICD-10-CM

## 2018-12-19 DIAGNOSIS — E1169 Type 2 diabetes mellitus with other specified complication: Secondary | ICD-10-CM

## 2018-12-19 DIAGNOSIS — Z114 Encounter for screening for human immunodeficiency virus [HIV]: Secondary | ICD-10-CM

## 2018-12-19 DIAGNOSIS — Z5900 Homelessness unspecified: Secondary | ICD-10-CM

## 2018-12-19 DIAGNOSIS — E785 Hyperlipidemia, unspecified: Secondary | ICD-10-CM | POA: Diagnosis not present

## 2018-12-19 DIAGNOSIS — E119 Type 2 diabetes mellitus without complications: Secondary | ICD-10-CM | POA: Diagnosis not present

## 2018-12-19 DIAGNOSIS — C50512 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: Secondary | ICD-10-CM | POA: Diagnosis not present

## 2018-12-19 DIAGNOSIS — Z1159 Encounter for screening for other viral diseases: Secondary | ICD-10-CM

## 2018-12-19 DIAGNOSIS — F17210 Nicotine dependence, cigarettes, uncomplicated: Secondary | ICD-10-CM

## 2018-12-19 DIAGNOSIS — E1165 Type 2 diabetes mellitus with hyperglycemia: Secondary | ICD-10-CM | POA: Diagnosis not present

## 2018-12-19 DIAGNOSIS — J40 Bronchitis, not specified as acute or chronic: Secondary | ICD-10-CM

## 2018-12-19 LAB — POCT URINALYSIS DIP (CLINITEK)
Bilirubin, UA: NEGATIVE
Blood, UA: NEGATIVE
Glucose, UA: NEGATIVE mg/dL
Ketones, POC UA: NEGATIVE mg/dL
Nitrite, UA: NEGATIVE
POC PROTEIN,UA: NEGATIVE
Spec Grav, UA: 1.02 (ref 1.010–1.025)
Urobilinogen, UA: 0.2 E.U./dL
pH, UA: 5 (ref 5.0–8.0)

## 2018-12-19 MED ORDER — BUDESONIDE-FORMOTEROL FUMARATE 160-4.5 MCG/ACT IN AERO: 2.0000 | INHALATION_SPRAY | Freq: Two times a day (BID) | RESPIRATORY_TRACT | 3 refills | Status: DC

## 2018-12-19 MED ORDER — AMLODIPINE BESYLATE 10 MG PO TABS: 10.0000 mg | ORAL_TABLET | Freq: Every day | ORAL | 3 refills | Status: DC

## 2018-12-19 MED ORDER — METFORMIN HCL 1000 MG PO TABS: 1000.0000 mg | ORAL_TABLET | Freq: Two times a day (BID) | ORAL | 3 refills | Status: DC

## 2018-12-19 MED ORDER — SERTRALINE HCL 100 MG PO TABS: 100.0000 mg | ORAL_TABLET | Freq: Every day | ORAL | 3 refills | Status: DC

## 2018-12-19 MED ORDER — TRAZODONE HCL 50 MG PO TABS: 50.0000 mg | ORAL_TABLET | Freq: Every day | ORAL | 3 refills | Status: DC

## 2018-12-19 MED ORDER — ATORVASTATIN CALCIUM 40 MG PO TABS: 40.0000 mg | ORAL_TABLET | Freq: Every day | ORAL | 2 refills | Status: DC

## 2018-12-19 NOTE — Assessment & Plan Note (Signed)
History of breast cancer need for follow-up with oncology

## 2018-12-19 NOTE — Assessment & Plan Note (Signed)
Poorly controlled diabetes it seems to be improving with current insulin program  We will continue Lantus 25 units daily and metformin thousand milligrams twice daily

## 2018-12-19 NOTE — Assessment & Plan Note (Signed)
Hypertension under improved control Will refill patient's current antihypertensive program to include amlodipine 10 mg daily

## 2018-12-19 NOTE — Patient Instructions (Signed)
Refills on your medications were sent to your [facility] pharmacy  We ordered Symbicort to start at 2 puffs twice daily  There is no change in your medication dosing  Please continue to work on smoking cessation  Referral to oncology was made again for breast cancer follow-up  Labs today will include a urinalysis hepatitis C and HIV screen  We do recommend a tetanus vaccine but we are out of these today we will follow-up with you on the next visit  Return in 2 months

## 2018-12-19 NOTE — Progress Notes (Signed)
Subjective:    Patient ID: Lori Brown, female    DOB: 11-13-1960, 58 y.o.   MRN: [id number]  58 y.o. F with type 2 diabetes, hypertension, COPD, hyperlipidemia, localized breast cancer status post treatment  The patient was in the [place] and now has moved into more stable housing  Patient was last seen in December and now is here for return follow-up.  Patient does have [place] healthcare Medicare advantage insurance  The patient is still smoking but is now down to 1 cigarette every other day.  The patient continues to take her blood pressure medicines and is maintaining her diabetic program.  She is not always compliant with her diabetic diet.   The patient is yet to been able to obtain a appointment with oncology.  The patient is in need for refills on all of her medications.   Past Medical History:  Diagnosis Date  . Asthma   . COPD (chronic obstructive pulmonary disease) ([place])   . Diabetes mellitus without complication ([place])   . Hypertension      History reviewed. No pertinent family history.   Social History   Socioeconomic History  . Marital status: Single    Spouse name: Not on file  . Number of children: Not on file  . Years of education: Not on file  . Highest education level: Not on file  Occupational History  . Not on file  Social Needs  . Financial resource strain: Not on file  . Food insecurity:    Worry: Not on file    Inability: Not on file  . Transportation needs:    Medical: Not on file    Non-medical: Not on file  Tobacco Use  . Smoking status: Current Every Day Smoker    Packs/day: 0.10    Types: Cigarettes  . Smokeless tobacco: Never Used  Substance and Sexual Activity  . Alcohol use: Yes    Comment: rarely   . Drug use: Not Currently  . Sexual activity: Not on file  Lifestyle  . Physical activity:    Days per week: Not on file    Minutes per session: Not on file  . Stress: Not on file  Relationships  . Social  connections:    Talks on phone: Not on file    Gets together: Not on file    Attends religious service: Not on file    Active member of club or organization: Not on file    Attends meetings of clubs or organizations: Not on file    Relationship status: Not on file  . Intimate partner violence:    Fear of current or ex partner: Not on file    Emotionally abused: Not on file    Physically abused: Not on file    Forced sexual activity: Not on file  Other Topics Concern  . Not on file  Social History Narrative  . Not on file     No Known Allergies   Outpatient Medications Prior to Visit  Medication Sig Dispense Refill  . EPINEPHrine 0.3 mg/0.3 mL IJ SOAJ injection Use as directed for severe allergic reaction 2 Device 2  . glucose blood (ONETOUCH VERIO) test strip Use as instructed to check blood sugar up to 3 times daily. 100 each 11  . insulin glargine (LANTUS) 100 UNIT/ML injection Inject 0.25 mLs (25 Units total) into the skin at bedtime. 10 mL 2  . ONETOUCH DELICA LANCETS 40X MISC Use to check blood sugar up to  3 times a day as directed. 100 each 11  . albuterol (PROVENTIL) (2.5 MG/3ML) 0.083% nebulizer solution Take 3 mLs (2.5 mg total) by nebulization every 6 (six) hours as needed for wheezing or shortness of breath. 75 mL 12  . amLODipine (NORVASC) 10 MG tablet Take 1 tablet (10 mg total) by mouth daily. 30 tablet 0  . atorvastatin (LIPITOR) 40 MG tablet Take 1 tablet (40 mg total) by mouth daily. 30 tablet 2  . metFORMIN (GLUCOPHAGE) 1000 MG tablet Take 1 tablet (1,000 mg total) by mouth 2 (two) times daily with a meal. 60 tablet 6  . benzonatate (TESSALON) 100 MG capsule Take 1 capsule (100 mg total) by mouth 3 (three) times daily as needed for cough. (Patient not taking: Reported on 10/23/2018) 60 capsule 0  . budesonide-formoterol (SYMBICORT) 160-4.5 MCG/ACT inhaler Inhale 2 puffs into the lungs 2 (two) times daily. (Patient not taking: Reported on 12/19/2018) 1 Inhaler 12  .  montelukast (SINGULAIR) 10 MG tablet Take 1 tablet (10 mg total) by mouth at bedtime. (Patient not taking: Reported on 12/19/2018) 30 tablet 5  . nystatin (MYCOSTATIN) 100000 UNIT/ML suspension Take 5 mLs (500,000 Units total) by mouth 4 (four) times daily. Swish, gargle in mouth and expectorate (Patient not taking: Reported on 10/23/2018) 60 mL 0  . sertraline (ZOLOFT) 100 MG tablet Take 100 mg by mouth daily.    . traZODone (DESYREL) 50 MG tablet Take 50 mg by mouth at bedtime.     No facility-administered medications prior to visit.      Review of Systems  Constitutional: Negative.   HENT: Negative.   Respiratory: Negative for cough, chest tightness, shortness of breath and wheezing.   Cardiovascular: Negative for chest pain.  Gastrointestinal: Negative.   Genitourinary: Negative.   Musculoskeletal: Negative.   Skin: Negative for rash.  Neurological: Negative.   Hematological: Negative.   Psychiatric/Behavioral: Negative.        Objective:   Physical Exam Vitals:   12/19/18 1423 12/19/18 1443  BP: (!) 156/79 (!) 146/92  Pulse: 79   Resp: 16   Temp: 98.4 F (36.9 C)   TempSrc: Oral   SpO2: 98%   Weight: 245 lb (111.1 kg)   Height: 5\' 9"  (1.753 m)     Gen: Pleasant, obese, in no distress,  normal affect  ENT: No lesions,  mouth prominent posterior oral candidiasis,  oropharynx clear, no postnasal drip  Neck: No JVD, no TMG, no carotid bruits  Lungs: No use of accessory muscles, no dullness to percussion, distant breath sounds, scattered expiratory wheezes   cardiovascular: RRR, heart sounds normal, no murmur or gallops  no peripheral edema  Abdomen: soft and NT, no HSM,  BS normal  Musculoskeletal: No deformities, no cyanosis or clubbing  Neuro: alert, non focal  Skin: Warm, no lesions or rashes   UA normal except trace leukocytes    Assessment & Plan:  I personally reviewed all images and lab data in the [facility] system as well as any outside material available  during this office visit and agree with the  radiology impressions.   HTN (hypertension) Hypertension under improved control Will refill patient's current antihypertensive program to include amlodipine 10 mg daily  COPD with chronic bronchitis (HCC) Chronic obstructive lung disease with asthmatic component now with reduced tobacco use  We will obtain Symbicort 162 inhalations twice daily and continue as needed albuterol  DM (diabetes mellitus), type 2, uncontrolled ([place]) Poorly controlled diabetes it seems to be improving with  current insulin program  We will continue Lantus 25 units daily and metformin thousand milligrams twice daily  Hyperlipidemia associated with type 2 diabetes mellitus ([place]) Hyperlipidemia we will continue atorvastatin daily  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 [facility]) History of breast cancer need for follow-up with oncology  Tobacco user Smoking cessation counseling was given to the patient   Nusrat was seen today for hypertension.  Diagnoses and all orders for this visit:  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, unspecified estrogen receptor status ([place]) -     Ambulatory referral to Oncology  Encounter for screening for HIV -     HIV antibody (with reflex)  Need for hepatitis C screening test -     Hepatitis C Antibody  Uncontrolled type 2 diabetes mellitus with hyperglycemia (HCC) -     POCT URINALYSIS DIP ([place])  Essential hypertension  COPD with chronic bronchitis ([place])  Hyperlipidemia associated with type 2 diabetes mellitus ([place])  Tobacco user  Other orders -     traZODone (DESYREL) 50 MG tablet; Take 1 tablet (50 mg total) by mouth at bedtime. (Patient not taking: Reported on 12/19/2018) -     sertraline (ZOLOFT) 100 MG tablet; Take 1 tablet (100 mg total) by mouth daily. (Patient not taking: Reported on 12/19/2018) -     metFORMIN (GLUCOPHAGE) 1000 MG tablet; Take 1 tablet (1,000  mg total) by mouth 2 (two) times daily with a meal. -     budesonide-formoterol (SYMBICORT) 160-4.5 MCG/ACT inhaler; Inhale 2 puffs into the lungs 2 (two) times daily. (Patient not taking: Reported on 12/19/2018) -     atorvastatin (LIPITOR) 40 MG tablet; Take 1 tablet (40 mg total) by mouth daily. -     amLODipine (NORVASC) 10 MG tablet; Take 1 tablet (10 mg total) by mouth daily.  Hepatitis C and HIV testing were also done as part of the patient's primary care

## 2018-12-19 NOTE — Assessment & Plan Note (Signed)
Smoking cessation counseling was given to the patient

## 2018-12-19 NOTE — Assessment & Plan Note (Signed)
Hyperlipidemia we will continue atorvastatin daily

## 2018-12-19 NOTE — Assessment & Plan Note (Signed)
Chronic obstructive lung disease with asthmatic component now with reduced tobacco use  We will obtain Symbicort 162 inhalations twice daily and continue as needed albuterol

## 2018-12-20 ENCOUNTER — Telehealth: Payer: Self-pay | Admitting: Emergency Medicine

## 2018-12-20 LAB — HIV ANTIBODY (ROUTINE TESTING W REFLEX): HIV Screen 4th Generation wRfx: NONREACTIVE

## 2018-12-20 LAB — HEPATITIS C ANTIBODY: Hep C Virus Ab: <0.1 s/co ratio (ref 0.0–0.9)

## 2018-12-20 NOTE — Congregational Nurse Program (Signed)
  Dept: [facility] Nurse Program Note  Date of Encounter: 12/20/2018  Past Medical History: Past Medical History:  Diagnosis Date  . Asthma   . COPD (chronic obstructive pulmonary disease) ([place])   . Diabetes mellitus without complication ([place])   . Hypertension     Encounter Details: CNP Questionnaire - 12/20/18 1850      Questionnaire   Patient Status  Not Applicable    Race  Black or African American    Location Patient Served At  [place]    Uninsured  Not Applicable    Food  Yes, have food insecurities;Within past 12 months, food ran out with no money to buy more    Housing/Utilities  No permanent housing    Transportation  Yes, need transportation assistance;Within past 12 months, lack of transportation negatively impacted life    Interpersonal Safety  Yes, feel physically and emotionally safe where you currently live    Medication  No medication insecurities    Medical Provider  Yes    Referrals  Primary Care Provider/Clinic    ED Visit Averted  Not Applicable    Life-Saving Intervention Made  Not Applicable     Initial visit to see nurse here at [facility]. States she is a diabetic for 2 years now ,taking medications but not regularly ,blood sugars coming down but still runs around 230 mg or 240 mg, understands about AIC and its values. While hospitalized states blood sugar had gotten up to 600 mg due to be on steroids . Is an  asthmatic , has high blood pressure takes her medications ,has high cholesterol. Moved here from [place] where she has 2 grown daughters ,no relatives here in [place], family relatives live in [place] and [place] ,[place]. Is in school ,not working ,seen at [facility] by Dr Saralyn Pilar ? Who is at [facility]. Has a . Membership at [employer], understands need for exercise with diabetes. Counseled regarding nutrition and diabetes as she loves candy ,sodas ,potato chips and fruit. States when she  gets stressed she eats ,understands why she must watch some things she eats a lot of and do serving sizes. [employer] ,$16..00 per month.Has [employer] .Reviewed a chart with client on how diabetes affects body organs to understand importance of keeping diabetes under control.  Two time survivor of breast cancer ,was treated in [place], referred to support group but states she doesn't want to discuss that right now ,encouraged to follow up with appointment to connect with cancer center here for maintenance appointment,she has number to call and will do that this week.  Encouraged to take medications as ordered ,check blood sugars ,am and pm ,. Nurse will check blood sugar next week beffore dinner  and follow up on [employer] transportation.  Marland Kitchen

## 2018-12-20 NOTE — Telephone Encounter (Signed)
Patient called and verified with name and DOB. Patient informed of results. Patient acknowledged understanding of results.

## 2018-12-27 DIAGNOSIS — E11649 Type 2 diabetes mellitus with hypoglycemia without coma: Secondary | ICD-10-CM

## 2018-12-27 LAB — GLUCOSE, POCT (MANUAL RESULT ENTRY): POC Glucose: 155 mg/dl (ref 70–99)

## 2018-12-27 NOTE — Congregational Nurse Program (Signed)
  Dept: [facility] Nurse Program Note  Date of Encounter: 12/27/2018  Past Medical History: Past Medical History:  Diagnosis Date  . Asthma   . COPD (chronic obstructive pulmonary disease) ([place])   . Diabetes mellitus without complication ([place])   . Hypertension     Encounter Details: CNP Questionnaire - 12/27/18 1730      Questionnaire   Patient Status  Not Applicable    Race  Black or African American    Location Patient Served At  [place]    Uninsured  Not Applicable    Food  Within past 12 months, food ran out with no money to buy more    Housing/Utilities  No permanent housing    Transportation  Within past 12 months, lack of transportation negatively impacted life    Interpersonal Safety  Yes, feel physically and emotionally safe where you currently live    Medication  No medication insecurities    Referrals  Primary Care Provider/Clinic    ED Visit Averted  Not Applicable    Life-Saving Intervention Made  Not Applicable     Follow up from last week ,doing well blood sugar today was 155 mg @ 4;56 pm . Wants to quit smoking,discussed nutrition again and things to avoid ,serving sizes ,didn't take blood pressure medication last night got in late and didn't take diabetes medication,counsled again on  importance .  Sees Dr Joya Gaskins at  [facility] ,return in 3 months.Discussed walking  As a good source of exercise and she states she walks a lot.  Follw diabetes and blood pressure weekly.

## 2019-01-01 ENCOUNTER — Telehealth: Payer: Self-pay | Admitting: General Practice

## 2019-01-01 ENCOUNTER — Telehealth: Payer: Self-pay | Admitting: Critical Care Medicine

## 2019-01-01 NOTE — Telephone Encounter (Signed)
I called the pt and asked her to contact her Cancer MD in [place] so they can fax [place] records  I am routing this message to [place] and Alinda Sierras  Can you assist Lori Brown?   She will be calling back to today with the name of the MD and the Number to call to get records from her breast cancer Rx in [place] which is needed to get f/[facility] appt [facility]

## 2019-01-01 NOTE — Telephone Encounter (Signed)
Pt called in wanting to inform that she informed   [facility] address [address] [place] [address] [phone] Dr.Jack Erter Pt also states she called left VM  So they can relase medical records

## 2019-01-01 NOTE — Telephone Encounter (Signed)
Thanks Alinda Sierras.  She did not recall that when I spoke to her today and I thought that would be necessary.  She is homeless and she has a cell# and if you could f/u with her thanks

## 2019-01-01 NOTE — Telephone Encounter (Signed)
Thank You Dr Joya Gaskins  I spoke to her while back and I told her that she need to come to the office and sign a medical release form to obtain the records .

## 2019-01-08 ENCOUNTER — Other Ambulatory Visit: Payer: Self-pay | Admitting: Critical Care Medicine

## 2019-01-09 NOTE — Telephone Encounter (Signed)
Patient called requesting an Rx for the albuterol solution. Patient states that she had a nebulizer. Patient uses [facility] on [address]. Please f/u

## 2019-01-09 NOTE — Congregational Nurse Program (Signed)
  Dept: [facility] Nurse Program Note  Date of Encounter: 01/09/2019  Past Medical History: Past Medical History:  Diagnosis Date  . Asthma   . COPD (chronic obstructive pulmonary disease) ([place])   . Diabetes mellitus without complication ([place])   . Hypertension     Encounter Details: CNP Questionnaire - 01/09/19 1921      Questionnaire   Patient Status  Not Applicable    Race  Black or African American    Location Patient Served At  [place]    Uninsured  Not Applicable    Food  No food insecurities;Within past 12 months, food ran out with no money to buy more    Housing/Utilities  No permanent housing    Transportation  No transportation needs;Within past 12 months, lack of transportation negatively impacted life    Interpersonal Safety  Yes, feel physically and emotionally safe where you currently live    Medication  Yes, have medication insecurities    Medical Provider  Yes    Referrals  Primary Care Provider/Clinic;Area Agency    ED Visit Averted  Not Applicable    Life-Saving Intervention Made  Not Applicable     In today after dinner ,hasnt been able to check her blood sugar needs a battery for her glucometer ?  At the security desk taking medication. Ate dinner 1 hour 15 minutes ago ,nurse will check blood sugar ,counseled regarding best times to take blood sugar ,reading was 225 mg,counseled on nutrition and carb's.Understands but likes to eat.Talked about portion sizes to work on that . Will check blood sugar tomorrow before dinner. Needs medications for asthma ,has some albuterol and case manager to pick up for her tomorrow ,if not nurse will assist .

## 2019-01-10 ENCOUNTER — Other Ambulatory Visit: Payer: Self-pay | Admitting: Critical Care Medicine

## 2019-01-10 DIAGNOSIS — E1169 Type 2 diabetes mellitus with other specified complication: Secondary | ICD-10-CM

## 2019-01-10 LAB — GLUCOSE, POCT (MANUAL RESULT ENTRY): POC Glucose: 89 mg/dl (ref 70–99)

## 2019-01-10 MED ORDER — ALBUTEROL SULFATE (2.5 MG/3ML) 0.083% IN NEBU: INHALATION_SOLUTION | RESPIRATORY_TRACT | 0 refills | Status: AC

## 2019-01-10 NOTE — Congregational Nurse Program (Signed)
  Dept: [facility] Nurse Program Note  Date of Encounter: 01/10/2019  Past Medical History: Past Medical History:  Diagnosis Date  . Asthma   . COPD (chronic obstructive pulmonary disease) ([place])   . Diabetes mellitus without complication ([place])   . Hypertension     Encounter Details: CNP Questionnaire - 01/10/19 1621      Questionnaire   Patient Status  Not Applicable    Race  Black or African American    Location Patient Served At  Not Applicable    Insurance  Medicaid    Uninsured  Not Applicable    Food  No food insecurities;Within past 12 months, food ran out with no money to buy more    Housing/Utilities  No permanent housing    Transportation  Within past 12 months, lack of transportation negatively impacted life    Interpersonal Safety  Yes, feel physically and emotionally safe where you currently live    Medication  Yes, have medication insecurities    Medical Provider  Yes    Referrals  Area Agency;Primary Care Provider/Clinic    ED Visit Averted  Not Applicable    Life-Saving Intervention Made  Not Applicable     Nurse intervened for client and made a face to face contact with her PCP  Dr. Joya Gaskins whom was helpful in prescribing her needed medications . A record check this pm showed medications had been called in to [facility] for pick up . Client doing well ,discussed ways to do her nebulizer treatments without exposing room mates to mist . She will only do them when in the room alone . Her blood sugar was amazingly low 89 mg @ 3:15 pm . States she took her am medications ,ate breakfast and lunch ,is not taking the metformin correctly taking both doses at pm after dinner meal  ,discussed proper order for medication. Watching some of her food consumption trying to make healthy choices . Discussed  Portion sizes and carbs. Caution on low blood sugars and the need to take medications as ordered . Client very cooperative and understanding her diabetes more .   Follow weekly.

## 2019-01-10 NOTE — Telephone Encounter (Signed)
Refill request

## 2019-01-16 DIAGNOSIS — E11649 Type 2 diabetes mellitus with hypoglycemia without coma: Secondary | ICD-10-CM

## 2019-01-16 LAB — GLUCOSE, POCT (MANUAL RESULT ENTRY): POC Glucose: 135 mg/dl (ref 70–99)

## 2019-01-16 NOTE — Congregational Nurse Program (Signed)
  Dept: [facility] Nurse Program Note  Date of Encounter: 01/16/2019  Past Medical History: Past Medical History:  Diagnosis Date  . Asthma   . COPD (chronic obstructive pulmonary disease) ([place])   . Diabetes mellitus without complication ([place])   . Hypertension     Encounter Details: CNP Questionnaire - 01/16/19 1703      Questionnaire   Patient Status  Not Applicable    Race  Black or African American    Location Patient Served At  Not Applicable    Insurance  Medicaid    Uninsured  Not Applicable    Food  No food insecurities;Within past 12 months, food ran out with no money to buy more    Housing/Utilities  No permanent housing    Transportation  Within past 12 months, lack of transportation negatively impacted life;Yes, need transportation assistance    Interpersonal Safety  Yes, feel physically and emotionally safe where you currently live    Medication  No medication insecurities    Medical Provider  Yes    Referrals  Primary Care Provider/Clinic    ED Visit Averted  Not Applicable    Life-Saving Intervention Made  Not Applicable      weekly follow up on blood pressure and blood sugar . States she is feeling fine ,counseled regarding smoking in relation to asthma and other chronic  Diseases. Doing good with managing her  blood sugars .Got her medications and using nebulizer as needed ,when no one else is in the room for safety during this lock down and time of COVID -19.  Follow weekly

## 2019-01-17 DIAGNOSIS — E11649 Type 2 diabetes mellitus with hypoglycemia without coma: Secondary | ICD-10-CM

## 2019-01-17 LAB — GLUCOSE, POCT (MANUAL RESULT ENTRY): POC Glucose: 96 mg/dl (ref 70–99)

## 2019-01-17 NOTE — Congregational Nurse Program (Signed)
  Dept: [facility] Nurse Program Note  Date of Encounter: 01/17/2019  Past Medical History: Past Medical History:  Diagnosis Date  . Asthma   . COPD (chronic obstructive pulmonary disease) ([place])   . Diabetes mellitus without complication ([place])   . Hypertension     Encounter Details: CNP Questionnaire - 01/17/19 1727      Questionnaire   Patient Status  Not Applicable    Race  Black or African American    Location Patient Served At  [place]    Uninsured  Not Applicable    Food  No food insecurities;Within past 12 months, food ran out with no money to buy more    Housing/Utilities  No permanent housing    Transportation  Within past 12 months, lack of transportation negatively impacted life    Interpersonal Safety  Yes, feel physically and emotionally safe where you currently live    Medication  No medication insecurities    Medical Provider  Yes    Referrals  Other    ED Visit Averted  Not Applicable    Life-Saving Intervention Made  Not Applicable      In for follow up on blood sugar. States she had consumed  Lots of carb's on on yesterday when we took her blood sugar level and ate a bag of chips !! Wants  So much to keep her levels low. Counseled regarding serving sizes and exercise Follow weekly

## 2019-01-23 DIAGNOSIS — E11649 Type 2 diabetes mellitus with hypoglycemia without coma: Secondary | ICD-10-CM

## 2019-01-24 ENCOUNTER — Telehealth: Payer: Self-pay

## 2019-01-24 DIAGNOSIS — E11649 Type 2 diabetes mellitus with hypoglycemia without coma: Secondary | ICD-10-CM

## 2019-01-24 LAB — GLUCOSE, POCT (MANUAL RESULT ENTRY)
POC Glucose: 170 mg/dl (ref 70–99)
POC Glucose: 252 mg/dl (ref 70–99)

## 2019-01-24 NOTE — Congregational Nurse Program (Signed)
  Dept: [facility] Nurse Program Note  Date of Encounter: 01/24/2019  Past Medical History: Past Medical History:  Diagnosis Date  . Asthma   . COPD (chronic obstructive pulmonary disease) ([place])   . Diabetes mellitus without complication ([place])   . Hypertension     Encounter Details: CNP Questionnaire - 01/24/19 1807      Questionnaire   Patient Status  Not Applicable    Race  Black or African American    Location Patient Served At  [place]    Uninsured  Not Applicable    Food  No food insecurities    Housing/Utilities  No permanent housing    Transportation  Within past 12 months, lack of transportation negatively impacted life;Yes, need transportation assistance    Interpersonal Safety  Yes, feel physically and emotionally safe where you currently live    Medication  No medication insecurities;Provided medication assistance    Medical Provider  Yes    Referrals  Area Agency;Primary Care Provider/Clinic    ED Visit Averted  Not Applicable    Life-Saving Intervention Made  Not Applicable     In for blood sugar and blood pressure checks States she is ready to quit smoking as it has been difficult to get cigarettes during  quaratine,so she will just stop ,commended on her decision. Counseled regarding smoking and her health conditions. Ask about follow up appointment at [facility] she has not heard ,TC to [facility] to check on referral ,a note was  Made to referral coordinator who will contact [place] Next  appointment at  [facility] is scheduled for 02-20-2019 at 2:30 pm  . Nurse will assist with picking up medications from [facility]  Blood sugar high today reviewed breakfast and lunch choices with client .

## 2019-01-24 NOTE — Telephone Encounter (Signed)
Called [facility] to check on next appointment secured date and time ,given to client .also followed up on referral to [facility] ,they will contact client .

## 2019-01-24 NOTE — Congregational Nurse Program (Signed)
  Dept: [facility] Nurse Program Note  Date of Encounter: 01/23/2019  Past Medical History: Past Medical History:  Diagnosis Date  . Asthma   . COPD (chronic obstructive pulmonary disease) ([place])   . Diabetes mellitus without complication ([place])   . Hypertension     Encounter Details: CNP Questionnaire - 01/24/19 1756      Questionnaire   Patient Status  Not Applicable    Race  Black or African American    Location Patient Served At  [place]    Uninsured  Not Applicable    Food  No food insecurities    Housing/Utilities  No permanent housing    Transportation  Yes, need transportation assistance;Within past 12 months, lack of transportation negatively impacted life    Interpersonal Safety  Yes, feel physically and emotionally safe where you currently live    Medication  No medication insecurities;Provided medication assistance    Medical Provider  Yes    Referrals  Primary Care Provider/Clinic    ED Visit Averted  Not Applicable    Life-Saving Intervention Made  Not Applicable     Client in to get her blood sugar and blood pressure checked . States she is holding on well . Anxious about getting blood levels stabilized . Commended on progress she had made . Counseled again on portion size and ways to eliminate some sugars and carb's.     'Counseled regarding her blood sugar level . Admits she ate some cake about 1/2 hour ago ,fairly large slice.  Blood sugar 170 mg  @  3:25. Will not have desert for dinner.

## 2019-01-30 ENCOUNTER — Telehealth: Payer: Self-pay

## 2019-01-30 DIAGNOSIS — E11649 Type 2 diabetes mellitus with hypoglycemia without coma: Secondary | ICD-10-CM

## 2019-01-30 LAB — GLUCOSE, POCT (MANUAL RESULT ENTRY): POC Glucose: 172 mg/dl (ref 70–99)

## 2019-01-30 NOTE — Congregational Nurse Program (Signed)
  Dept: [facility] Nurse Program Note  Date of Encounter: 01/30/2019  Past Medical History: Past Medical History:  Diagnosis Date  . Asthma   . COPD (chronic obstructive pulmonary disease) ([place])   . Diabetes mellitus without complication ([place])   . Hypertension     Encounter Details: CNP Questionnaire - 01/30/19 1901      Questionnaire   Patient Status  Not Applicable    Race  Black or African American    Location Patient Served At  [place]    Uninsured  Not Applicable    Food  No food insecurities    Housing/Utilities  No permanent housing    Transportation  No transportation needs    Interpersonal Safety  Yes, feel physically and emotionally safe where you currently live    Medication  No medication insecurities    Medical Provider  Yes    Referrals  Area Agency    ED Visit Averted  Not Applicable    Life-Saving Intervention Made  Not Applicable     Weekly visit to monitor blood pressure and blood sugar . Counseled regarding nutrition and exercise . Today was a little upset regarding parents and their children being uncontrolled ,allowed her to talk and offered support . Encouraged to talk with case management regarding unruly children . Very social able today . Doing okay with lengthy quarantine  Counseled regarding knowing A1-C and her work to improve her diabetes.  Follow weekly .

## 2019-01-30 NOTE — Telephone Encounter (Signed)
Checking on status of SCAT application ,no answer, left message  !

## 2019-02-06 DIAGNOSIS — E11649 Type 2 diabetes mellitus with hypoglycemia without coma: Secondary | ICD-10-CM

## 2019-02-06 LAB — GLUCOSE, POCT (MANUAL RESULT ENTRY): POC Glucose: 243 mg/dl (ref 70–99)

## 2019-02-06 NOTE — Congregational Nurse Program (Signed)
  Dept: [facility] Nurse Program Note  Date of Encounter: 02/06/2019  Past Medical History: Past Medical History:  Diagnosis Date  . Asthma   . COPD (chronic obstructive pulmonary disease) ([place])   . Diabetes mellitus without complication ([place])   . Hypertension     Encounter Details: CNP Questionnaire - 02/06/19 1927      Questionnaire   Patient Status  Not Applicable    Race  Black or African American    Location Patient Served At  [place]    Uninsured  Not Applicable    Food  No food insecurities    Housing/Utilities  No permanent housing    Transportation  Within past 12 months, lack of transportation negatively impacted life    Interpersonal Safety  Yes, feel physically and emotionally safe where you currently live    Medication  No medication insecurities    Medical Provider  Yes    Referrals  Primary Care Provider/Clinic    ED Visit Averted  Not Applicable    Life-Saving Intervention Made  Not Applicable      Follow up on blood pressure and blood sugar Doing well , didn't take her insulin last night she was out of syringes ,nurse gave client a box of syringes . Counseled regarding her blood sugar ans the need to take all her medications daily. She understands and can see importance as today's reading was higher than usual  Follow weekly

## 2019-02-07 ENCOUNTER — Telehealth: Payer: Self-pay

## 2019-02-07 DIAGNOSIS — E11649 Type 2 diabetes mellitus with hypoglycemia without coma: Secondary | ICD-10-CM

## 2019-02-07 LAB — GLUCOSE, POCT (MANUAL RESULT ENTRY): POC Glucose: 189 mg/dl (ref 70–99)

## 2019-02-07 NOTE — Congregational Nurse Program (Signed)
  Dept: [facility] Nurse Program Note  Date of Encounter: 02/07/2019  Past Medical History: Past Medical History:  Diagnosis Date  . Asthma   . COPD (chronic obstructive pulmonary disease) ([place])   . Diabetes mellitus without complication ([place])   . Hypertension     Encounter Details: CNP Questionnaire - 02/07/19 1642      Questionnaire   Patient Status  Not Applicable    Race  Black or African American    Location Patient Served At  [place]    Uninsured  Not Applicable    Food  No food insecurities    Housing/Utilities  No permanent housing    Transportation  Within past 12 months, lack of transportation negatively impacted life    Interpersonal Safety  Yes, feel physically and emotionally safe where you currently live    Medication  No medication insecurities    Medical Provider  Yes    Referrals  Primary Care Provider/Clinic     Follow up  visit for blood pressure,blood sugar Doing well today , .offered support and encouragement on her management of diabetes. Nutrition  Counseling given . Since shelter in place client needs time to talk about family members as she is missing them  Supportive counseling

## 2019-02-07 NOTE — Telephone Encounter (Signed)
TC to [facility] to follow up on transportation application ,left message

## 2019-02-13 ENCOUNTER — Telehealth: Payer: Self-pay

## 2019-02-13 DIAGNOSIS — E11649 Type 2 diabetes mellitus with hypoglycemia without coma: Secondary | ICD-10-CM

## 2019-02-13 LAB — GLUCOSE, POCT (MANUAL RESULT ENTRY): POC Glucose: 134 mg/dl (ref 70–99)

## 2019-02-13 NOTE — Telephone Encounter (Signed)
Left message to check on application , if no word will complete another form .

## 2019-02-13 NOTE — Congregational Nurse Program (Signed)
  Dept: [facility] Nurse Program Note  Date of Encounter: 02/13/2019  Past Medical History: Past Medical History:  Diagnosis Date  . Asthma   . COPD (chronic obstructive pulmonary disease) ([place])   . Diabetes mellitus without complication ([place])   . Hypertension     Encounter Details: CNP Questionnaire - 02/13/19 1902      Questionnaire   Patient Status  Not Applicable    Race  Black or African American    Location Patient Served At  [place]    Uninsured  Not Applicable    Food  No food insecurities    Housing/Utilities  No permanent housing    Transportation  Within past 12 months, lack of transportation negatively impacted life    Interpersonal Safety  Yes, feel physically and emotionally safe where you currently live    Medication  No medication insecurities    Medical Provider  Yes    Referrals  Area Agency;Dental;Primary Care Provider/Clinic    ED Visit Averted  Not Applicable    Life-Saving Intervention Made  Not Applicable     In today with some dental pain states it is better had som medication she could use for the pain ,expressed to client to let us know if she feels something has to be done about dental pain as she is presently on shelter in place. Gave her several dentist to call to see if they can see her if she needs   To be seen ,will call ,blood pressure and blood sugars good today ,talked about good food choices a and serving sizes. She has really made progress on watching her blood sugars and changing some food choices. Will let case management know about dental pain in case she needs to be seen .  Called SCAT today ,left message.

## 2019-02-14 ENCOUNTER — Telehealth: Payer: Self-pay

## 2019-02-14 DIAGNOSIS — E11649 Type 2 diabetes mellitus with hypoglycemia without coma: Secondary | ICD-10-CM

## 2019-02-14 LAB — GLUCOSE, POCT (MANUAL RESULT ENTRY): POC Glucose: 102 mg/dl (ref 70–99)

## 2019-02-14 NOTE — Telephone Encounter (Signed)
TC to Oncology Dr Gilmore Laroche in [place] to get medical records sent to [facility] so client can be scheduled for  follow at [facility]. Spoke with clerk to forward request to medical records office.

## 2019-02-14 NOTE — Congregational Nurse Program (Signed)
  Dept: [facility] Nurse Program Note  Date of Encounter: 02/14/2019  Past Medical History: Past Medical History:  Diagnosis Date  . Asthma   . COPD (chronic obstructive pulmonary disease) ([place])   . Diabetes mellitus without complication ([place])   . Hypertension     Encounter Details: CNP Questionnaire - 02/14/19 1832      Questionnaire   Patient Status  Not Applicable    Race  Black or African American    Location Patient Served At  [place]  No food insecurities    Housing/Utilities  No permanent housing    Transportation  Yes, need transportation assistance;Within past 12 months, lack of transportation negatively impacted life    Interpersonal Safety  Yes, feel physically and emotionally safe where you currently live    Medication  No medication insecurities    Medical Provider  Yes    Referrals  Other;Primary Care Provider/Clinic    ED Visit Averted  Not Applicable    Life-Saving Intervention Made  Not Applicable      In today for blood sugar check ,reults good better management . Tooth ache okay today but called the list of dental given and they accept her insurance and she will make an appointment as soon as shelter in place is lifted  or if tooth ache gets worst.  Follow weekly Assisted with telephone call to get Oncology records from [place] reached clinic and will forward information to records staff.

## 2019-02-19 ENCOUNTER — Telehealth: Payer: Self-pay | Admitting: Critical Care Medicine

## 2019-02-19 DIAGNOSIS — C50512 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: Secondary | ICD-10-CM

## 2019-02-19 NOTE — Telephone Encounter (Signed)
I was able to to obtain the oncology records and will make another breast cancer f/u referral to oncology

## 2019-02-19 NOTE — Progress Notes (Signed)
Patient ID: Lori Brown, female   DOB: Aug 28, 1961, 58 y.o.   MRN: [id number]  Virtual Visit via Telephone Note  I connected with Lori Brown on 02/20/19 at  2:30 PM EDT by telephone and verified that I am speaking with the correct person using two identifiers.   Consent:  I discussed the limitations, risks, security and privacy concerns of performing an evaluation and management service by telephone and the availability of in person appointments. I also discussed with the patient that there may be a patient responsible charge related to this service. The patient expressed understanding and agreed to proceed.  Location of patient: In the patient's shelter at [facility] of provider: In my office  Persons participating in the televisit with the patient.  Only the patient was participating in the call    History of Present Illness: This is a 58 year old female with history of COPD diabetes type 2 and hypertension who I connected with a telephone visit on 02/20/2019.  The patient was last seen December 19, 2018 and at that time we felt hypertension was under good control with amlodipine 10 mg daily as well diabetes was under good control utilizing Lantus and metformin.  The patient also was on Symbicort twice daily for her COPD and has been working on smoking cessation.  Since that visit the patient is complained of an increased tooth ache in the right lower jaw she has a cavity and a tooth there with pulp showing and it is cavitary but no pus exuding.  She states her shortness of breath is stable.  Her blood sugars around 130 fasting and her blood pressure has been recorded in the 120-130/70-80 range by Congregational nursing.  She is down to 2 to 3 cigarettes daily.  She denies any other respiratory complaints.  She is in need of a refill on her EpiPen and also her trazodone  Pos in BOLD Constitutional:   No  weight loss, night sweats,  Fevers, chills, fatigue,  lassitude. HEENT:   No headaches,  Difficulty swallowing,  Tooth/dental problems,  Sore throat,                No sneezing, itching, ear ache, nasal congestion, post nasal drip,   CV:  No chest pain,  Orthopnea, PND, swelling in lower extremities, anasarca, dizziness, palpitations  GI  No heartburn, indigestion, abdominal pain, nausea, vomiting, diarrhea, change in bowel habits, loss of appetite  Resp:  shortness of breath with exertion Not  at rest.  No excess mucus, no productive cough,  No non-productive cough,  No coughing up of blood.  No change in color of mucus.  No wheezing.  No chest wall deformity  Skin: no rash or lesions.  GU: no dysuria, change in color of urine, no urgency or frequency.  No flank pain.  MS:  No joint pain or swelling.  No decreased range of motion.  No back pain.  Psych:  No change in mood or affect. No depression or anxiety.  No memory loss.  Observations/Objective: This was a telephone visit no observations were made  Assessment and Plan: #1 chronic obstructive lung disease with associated asthma overlap: For this the patient will maintain Symbicort 2 inhalations twice daily and as needed albuterol and continue to pursue smoking cessation  #2 hypertension well-controlled at this time on amlodipine: For this the patient will maintain amlodipine 10 mg daily  #3 diabetes type 2 well controlled at this time: Patient will maintain Lantus 25  units at bedtime and metformin 1000 mg twice daily  The patient was requested to obtain an eye exam and will need to come into the office for a foot exam as well  Note the patient also is in need of a tetanus vaccine which will be achieved at the next office visit  #4 dental caries with severe tooth pain: The patient and I both wish to avoid opiates so we will prescribe ibuprofen 600 mg every 8 hours as needed for pain until she can see a dentist    Follow Up Instructions: Patient understands will be an in office  visit scheduled within a month and refills of her trazodone and EpiPen will be taken to her by Vivian from our pharmacy   I discussed the assessment and treatment plan with the patient. The patient was provided an opportunity to ask questions and all were answered. The patient agreed with the plan and demonstrated an understanding of the instructions.   The patient was advised to call back or seek an in-person evaluation if the symptoms worsen or if the condition fails to improve as anticipated.  I provided 25 minutes of non-face-to-face time during this encounter  including  median intraservice time , review of notes, labs, imaging, medications  and explaining diagnosis and management to the patient .    Asencion Noble, MD

## 2019-02-19 NOTE — Telephone Encounter (Signed)
Gm Dr Joya Gaskins  I remember  her  I sent the referral to [facility]  Waiting for appointment

## 2019-02-20 ENCOUNTER — Telehealth: Payer: Self-pay

## 2019-02-20 ENCOUNTER — Encounter: Payer: Self-pay | Admitting: Critical Care Medicine

## 2019-02-20 ENCOUNTER — Ambulatory Visit: Payer: 59 | Attending: Critical Care Medicine | Admitting: Critical Care Medicine

## 2019-02-20 DIAGNOSIS — K029 Dental caries, unspecified: Secondary | ICD-10-CM | POA: Insufficient documentation

## 2019-02-20 DIAGNOSIS — Z794 Long term (current) use of insulin: Secondary | ICD-10-CM

## 2019-02-20 DIAGNOSIS — Z5900 Homelessness unspecified: Secondary | ICD-10-CM

## 2019-02-20 DIAGNOSIS — E119 Type 2 diabetes mellitus without complications: Secondary | ICD-10-CM

## 2019-02-20 DIAGNOSIS — J449 Chronic obstructive pulmonary disease, unspecified: Secondary | ICD-10-CM | POA: Diagnosis not present

## 2019-02-20 DIAGNOSIS — I10 Essential (primary) hypertension: Secondary | ICD-10-CM | POA: Diagnosis not present

## 2019-02-20 DIAGNOSIS — Z720 Tobacco use: Secondary | ICD-10-CM

## 2019-02-20 DIAGNOSIS — E11649 Type 2 diabetes mellitus with hypoglycemia without coma: Secondary | ICD-10-CM

## 2019-02-20 LAB — GLUCOSE, POCT (MANUAL RESULT ENTRY): POC Glucose: 109 mg/dl (ref 70–99)

## 2019-02-20 MED ORDER — EPINEPHRINE 0.3 MG/0.3ML IJ SOAJ: INTRAMUSCULAR | 2 refills | Status: DC

## 2019-02-20 MED ORDER — IBUPROFEN 600 MG PO TABS: 600.0000 mg | ORAL_TABLET | Freq: Three times a day (TID) | ORAL | 0 refills | Status: AC | PRN

## 2019-02-20 MED ORDER — TRAZODONE HCL 50 MG PO TABS: 50.0000 mg | ORAL_TABLET | Freq: Every day | ORAL | 3 refills | Status: DC

## 2019-02-20 MED ORDER — EPINEPHRINE 0.3 MG/0.3ML IJ SOAJ: INTRAMUSCULAR | 2 refills | Status: AC

## 2019-02-20 MED FILL — IBUPROFEN 600 MG TABLET: 600 | 5 days supply | Qty: 40 | Fill #0

## 2019-02-20 MED FILL — traZODone HCL 50 MG TABS: 50 | 30 days supply | Qty: 30 | Fill #0

## 2019-02-20 NOTE — Telephone Encounter (Signed)
Nurse picked up medications ordered by MD for tooth ache ,Epi pen to be picked up at [facility] Medications given to client at [place].

## 2019-02-20 NOTE — Congregational Nurse Program (Signed)
  Dept: [facility] Nurse Program Note  Date of Encounter: 02/20/2019  Past Medical History: Past Medical History:  Diagnosis Date  . Asthma   . COPD (chronic obstructive pulmonary disease) ([place])   . Diabetes mellitus without complication ([place])   . Hypertension     Encounter Details: CNP Questionnaire - 02/20/19 1906      Questionnaire   Patient Status  Not Applicable    Race  Black or African American    Location Patient Served At  [place]    Uninsured  Not Applicable    Food  No food insecurities    Housing/Utilities  No permanent housing    Transportation  Yes, need transportation assistance;Within past 12 months, lack of transportation negatively impacted life    Interpersonal Safety  Yes, feel physically and emotionally safe where you currently live    Medication  No medication insecurities    Medical Provider  Yes    Referrals  Medication Assistance    ED Visit Averted  Not Applicable    Life-Saving Intervention Made  Not Applicable     In today had her virtual visit with her PCP states it went well her has prescribed her some medications that need to be picked up especially medication for tooth ache . Nurse will pick up ,call from PCP's office. Counseled her about tooth ache and need to see a dentist ,will let us know if pain is unbearable and will get her to the dentist as the shelter continues to be on lock down ,but essential visits can be accommodated. Completed application for SCAT and it was faxed over by case management.  Blood sugar great today . Annual PE scheduled for 04-24-2019 will get blood work and A1-C . Counseled

## 2019-02-20 NOTE — Progress Notes (Signed)
Called patient to initiate their telephone visit with provider Asencion Noble, MD. Verified date of birth. Patient states that her FSBS & BP readings have been good at the shelter. FSBS was 129 fasting at last check. Does need a refill on her epi pen.Marland Kitchen KWalker, CMA.

## 2019-02-20 NOTE — Telephone Encounter (Signed)
Good Afternoon,  I just wanted to let you know that patient just had a tele-visit with Dr. Joya Gaskins at [facility]. He did prescribe medications & wanted to reach out so that we could coordinate pick up & delivery of patient's meds.

## 2019-02-21 NOTE — Telephone Encounter (Signed)
Medications picked up

## 2019-02-21 NOTE — Congregational Nurse Program (Signed)
  Dept: [facility] Nurse Program Note  Date of Encounter: 02/21/2019  Past Medical History: Past Medical History:  Diagnosis Date  . Asthma   . COPD (chronic obstructive pulmonary disease) ([place])   . Diabetes mellitus without complication ([place])   . Hypertension     Encounter Details: CNP Questionnaire - 02/21/19 1821      Questionnaire   Patient Status  Not Applicable    Race  Black or African American    Location Patient Served At  [place]    Uninsured  Not Applicable    Food  No food insecurities    Housing/Utilities  No permanent housing    Transportation  No transportation needs    Interpersonal Safety  Yes, feel physically and emotionally safe where you currently live    Medication  No medication insecurities    Medical Provider  Yes    Referrals  Dental    ED Visit Averted  Not Applicable    Life-Saving Intervention Made  Not Applicable      Dental pain okay ,started medications on yesterday . Will call for appointment after May 8.  Doing well today

## 2019-02-28 DIAGNOSIS — E11649 Type 2 diabetes mellitus with hypoglycemia without coma: Secondary | ICD-10-CM

## 2019-02-28 LAB — GLUCOSE, POCT (MANUAL RESULT ENTRY): POC Glucose: 231 mg/dl (ref 70–99)

## 2019-02-28 NOTE — Congregational Nurse Program (Signed)
  Dept: [facility] Nurse Program Note  Date of Encounter: 02/28/2019  Past Medical History: Past Medical History:  Diagnosis Date  . Asthma   . COPD (chronic obstructive pulmonary disease) ([place])   . Diabetes mellitus without complication ([place])   . Hypertension     Encounter Details: CNP Questionnaire - 02/28/19 1753      Questionnaire   Patient Status  Not Applicable    Race  Black or African American    Location Patient Served At  [place]    Uninsured  Not Applicable    Food  No food insecurities    Housing/Utilities  No permanent housing    Transportation  Yes, need transportation assistance;Within past 12 months, lack of transportation negatively impacted life    Interpersonal Safety  Yes, feel physically and emotionally safe where you currently live    Medication  No medication insecurities    Medical Provider  Yes    Referrals  Area Agency;Primary Care Provider/Clinic    ED Visit Averted  Not Applicable    Life-Saving Intervention Made  Not Applicable      weekly follow up  On blood sugar and blood pressure  Jeris Penta tout for a walk ,enjoying out time from shelter in place, hasn't heard on SCAT application will call next week or Oncology referral will call to follow up soon, Had for a late lunch fried chicken and potato salad, talked about portion size ,carbohydrates and okay to eat favorites some time ,remembering portio size . Will monitor weekly . To take pm medications .

## 2019-03-13 DIAGNOSIS — E11649 Type 2 diabetes mellitus with hypoglycemia without coma: Secondary | ICD-10-CM

## 2019-03-13 DIAGNOSIS — E1169 Type 2 diabetes mellitus with other specified complication: Secondary | ICD-10-CM

## 2019-03-13 LAB — GLUCOSE, POCT (MANUAL RESULT ENTRY): POC Glucose: 114 mg/dl (ref 70–99)

## 2019-03-13 NOTE — Congregational Nurse Program (Signed)
  Dept: [facility] Nurse Program Note  Date of Encounter: 03/13/2019  Past Medical History: Past Medical History:  Diagnosis Date  . Asthma   . COPD (chronic obstructive pulmonary disease) ([place])   . Diabetes mellitus without complication ([place])   . Hypertension     Encounter Details: CNP Questionnaire - 03/13/19 1811      Questionnaire   Patient Status  Not Applicable    Race  Black or African American    Location Patient Served At  Not [place]    Uninsured  Not Applicable    Food  No food insecurities    Housing/Utilities  No permanent housing    Transportation  Within past 12 months, lack of transportation negatively impacted life    Interpersonal Safety  Yes, feel physically and emotionally safe where you currently live    Medication  No medication insecurities    Referrals  Primary Care Provider/Clinic;Area Agency    ED Visit Averted  Not Applicable    Life-Saving Intervention Made  Not Applicable     Weekly follow up visit,states she is tired today went to [facility] yesterday to see a sick sister ,states she did a lot and was somewhat stressed. Blood pressure up today ,counseled . Blood sugar good ,stating she is doing what she needs to do.  Follow wwekly

## 2019-03-21 DIAGNOSIS — E1169 Type 2 diabetes mellitus with other specified complication: Secondary | ICD-10-CM

## 2019-03-21 DIAGNOSIS — E11649 Type 2 diabetes mellitus with hypoglycemia without coma: Secondary | ICD-10-CM

## 2019-03-21 NOTE — Congregational Nurse Program (Signed)
  Dept: [facility] Nurse Program Note  Date of Encounter: 03/21/2019  Past Medical History: Past Medical History:  Diagnosis Date  . Asthma   . COPD (chronic obstructive pulmonary disease) ([place])   . Diabetes mellitus without complication ([place])   . Hypertension     Encounter Details: CNP Questionnaire - 03/21/19 1815      Questionnaire   Patient Status  Not Applicable    Race  Black or African American    Location Patient Served At  [place]    Uninsured  Not Applicable    Food  No food insecurities    Housing/Utilities  No permanent housing    Transportation  No transportation needs;Within past 12 months, lack of transportation negatively impacted life    Interpersonal Safety  Yes, feel physically and emotionally safe where you currently live    Medication  No medication insecurities    Medical Provider  Yes    Referrals  Primary Care Provider/Clinic    ED Visit Averted  Not Applicable    Life-Saving Intervention Made  Not Applicable     Nyoka Lint great ,watching some things she likes to eat ,feelings her blood sugars are stable. Good vitals and blood sugar ,commended on care and taking her medications . Continues to search for housing .  Blood sugar @ 4:55 pm today was 110 mg . Controlling blood sugar well.

## 2019-03-27 DIAGNOSIS — E1169 Type 2 diabetes mellitus with other specified complication: Secondary | ICD-10-CM

## 2019-03-27 LAB — GLUCOSE, POCT (MANUAL RESULT ENTRY): POC Glucose: 156 mg/dl (ref 70–99)

## 2019-03-27 NOTE — Congregational Nurse Program (Signed)
  Dept: [facility] Nurse Program Note  Date of Encounter: 03/27/2019  Past Medical History: Past Medical History:  Diagnosis Date  . Asthma   . COPD (chronic obstructive pulmonary disease) ([place])   . Diabetes mellitus without complication ([place])   . Hypertension     Encounter Details: CNP Questionnaire - 03/27/19 1747      Questionnaire   Patient Status  Not Applicable    Race  Black or African American    Location Patient Served At  [place]    Uninsured  Not Applicable    Food  No food insecurities    Housing/Utilities  No permanent housing    Transportation  No transportation needs;Within past 12 months, lack of transportation negatively impacted life    Interpersonal Safety  Yes, feel physically and emotionally safe where you currently live    Medication  No medication insecurities    Medical Provider  Yes    Referrals  Primary Care Provider/Clinic    ED Visit Averted  Not Applicable    Life-Saving Intervention Made  Not Applicable     Follow up on blood pressure and blood sugar. In today with letter from [place] asking for complete information to complete application. Nurse completed and had it faxed in by her case manager,copy given to client. Blood pressure up today ,counseled and blood sugar .States she had skittles today !! Will take a walk ,cautioned about heat and triggers for asthma attack.  Follow  Weekly.Blood sugar @ 2:44 pm ,2 hours after lunch per client was 156 mg.

## 2019-03-28 NOTE — Congregational Nurse Program (Signed)
  Dept: [facility] Nurse Program Note  Date of Encounter: 03/28/2019  Past Medical History: Past Medical History:  Diagnosis Date  . Asthma   . COPD (chronic obstructive pulmonary disease) ([place])   . Diabetes mellitus without complication ([place])   . Hypertension     Encounter Details: .   Brief  Visit today was her birthday ,had been out  For a walk ,some SOB ,cautioned to go relax and use her inhaler as she had left it in her room ,suffers from asthma and COPD and it was very hot outside.   Follow next week

## 2019-03-31 ENCOUNTER — Other Ambulatory Visit: Payer: Self-pay | Admitting: *Deleted

## 2019-03-31 DIAGNOSIS — Z20822 Contact with and (suspected) exposure to covid-19: Secondary | ICD-10-CM

## 2019-04-02 LAB — NOVEL CORONAVIRUS, NAA: SARS-CoV-2, NAA: NOT DETECTED

## 2019-04-03 ENCOUNTER — Other Ambulatory Visit: Payer: Self-pay | Admitting: Critical Care Medicine

## 2019-04-03 DIAGNOSIS — E11649 Type 2 diabetes mellitus with hypoglycemia without coma: Secondary | ICD-10-CM

## 2019-04-03 LAB — GLUCOSE, POCT (MANUAL RESULT ENTRY): POC Glucose: 122 mg/dl (ref 70–99)

## 2019-04-03 MED FILL — SYMBICORT 160-4.5 MCG INH: 160-4.5 | 30 days supply | Qty: 10 | Fill #2

## 2019-04-03 MED FILL — traZODone HCL 50 MG TABS: 50 | 30 days supply | Qty: 30 | Fill #1

## 2019-04-03 MED FILL — ALBUTEROL SUL 2.5 MG/3 ML S: (2.5 MG/3ML | 6 days supply | Qty: 75 | Fill #1

## 2019-04-03 NOTE — Congregational Nurse Program (Signed)
  Dept: [facility] Nurse Program Note  Date of Encounter: 04/03/2019  Past Medical History: Past Medical History:  Diagnosis Date  . Asthma   . COPD (chronic obstructive pulmonary disease) ([place])   . Diabetes mellitus without complication ([place])   . Hypertension     Encounter Details: CNP Questionnaire - 04/03/19 1217      Questionnaire   Patient Status  Not Applicable    Race  Black or African American    Location Patient Served At  [place]    Uninsured  Not Applicable    Food  No food insecurities    Housing/Utilities  No permanent housing    Transportation  Within past 12 months, lack of transportation negatively impacted life;Yes, need transportation assistance    Interpersonal Safety  Yes, feel physically and emotionally safe where you currently live    Medication  No medication insecurities    Medical Provider  Yes    Referrals  Primary Care Provider/Clinic;Area Agency    ED Visit Averted  Not Applicable    Life-Saving Intervention Made  Not Applicable      In for blood sugar check today ,states she is doing well. Vitals and blood sugar look good , counseled client . Needs help with getting medication refills . TC to pharmacy ,will fill and nurse will pick them up . Weather is bad and she would have to ride the bus to pick up. Was short of breath ,counseled regarding asthma and slowing down . Triggers for asthma highlighted as changes in weather ,getting wet . Trying to co-ordinate medication pick up in one place.  Follow tomorrow with medication pick up.

## 2019-04-04 NOTE — Congregational Nurse Program (Signed)
  Dept: [facility] Nurse Program Note  Date of Encounter: 04/04/2019  Past Medical History: Past Medical History:  Diagnosis Date  . Asthma   . COPD (chronic obstructive pulmonary disease) ([place])   . Diabetes mellitus without complication ([place])   . Hypertension     Encounter Details: CNP Questionnaire - 04/04/19 1225      Questionnaire   Patient Status  Not Applicable    Race  Black or African American    Location Patient Served At  [place]    Uninsured  Not Applicable    Food  No food insecurities    Housing/Utilities  No permanent housing    Transportation  Within past 12 months, lack of transportation negatively impacted life    Interpersonal Safety  Yes, feel physically and emotionally safe where you currently live    Medication  Provided medication assistance    Medical Provider  Yes    Referrals  Primary Care Provider/Clinic;Area Agency    ED Visit Averted  Not Applicable    Life-Saving Intervention Made  Not Applicable     Client in to secure medications picked up by nurse today . Will need to pick up her EPI -pen tomorrow Current Outpatient Medications Ordered in Epic  Medication Sig Dispense Refill  . albuterol (PROVENTIL) (2.5 MG/3ML) 0.083% nebulizer solution USE 1 VIAL VIA NEBULIZER THREE TIMES DAILY AS NEEDED 540 mL 0  . amLODipine (NORVASC) 10 MG tablet Take 1 tablet (10 mg total) by mouth daily. 30 tablet 3  . atorvastatin (LIPITOR) 40 MG tablet Take 1 tablet (40 mg total) by mouth daily. 30 tablet 2  . budesonide-formoterol (SYMBICORT) 160-4.5 MCG/ACT inhaler Inhale 2 puffs into the lungs 2 (two) times daily. 1 Inhaler 3  . EPINEPHrine 0.3 mg/0.3 mL IJ SOAJ injection Use as directed for severe allergic reaction 2 Device 2  . glucose blood (ONETOUCH VERIO) test strip Use as instructed to check blood sugar up to 3 times daily. 100 each 11  . ibuprofen (ADVIL) 600 MG tablet Take 1 tablet (600 mg total)  by mouth every 8 (eight) hours as needed. 40 tablet 0  . insulin glargine (LANTUS) 100 UNIT/ML injection Inject 0.25 mLs (25 Units total) into the skin at bedtime. 10 mL 2  . metFORMIN (GLUCOPHAGE) 1000 MG tablet Take 1 tablet (1,000 mg total) by mouth 2 (two) times daily with a meal. 60 tablet 3  . ONETOUCH DELICA LANCETS 27P MISC Use to check blood sugar up to 3 times a day as directed. 100 each 11  . sertraline (ZOLOFT) 100 MG tablet Take 1 tablet (100 mg total) by mouth daily. 30 tablet 3  . traZODone (DESYREL) 50 MG tablet Take 1 tablet (50 mg total) by mouth at bedtime. 30 tablet 3   No current Epic-ordered facility-administered medications on file.

## 2019-04-09 ENCOUNTER — Other Ambulatory Visit: Payer: Self-pay | Admitting: Pharmacist

## 2019-04-09 DIAGNOSIS — J449 Chronic obstructive pulmonary disease, unspecified: Secondary | ICD-10-CM

## 2019-04-09 MED ORDER — ALBUTEROL SULFATE HFA 108 (90 BASE) MCG/ACT IN AERS: 2.0000 | INHALATION_SPRAY | RESPIRATORY_TRACT | 2 refills | Status: DC | PRN

## 2019-04-10 ENCOUNTER — Other Ambulatory Visit: Payer: Self-pay | Admitting: Critical Care Medicine

## 2019-04-24 ENCOUNTER — Telehealth: Payer: Self-pay | Admitting: *Deleted

## 2019-04-24 ENCOUNTER — Telehealth: Payer: Self-pay

## 2019-04-24 ENCOUNTER — Ambulatory Visit: Payer: Medicare Other | Attending: Critical Care Medicine | Admitting: Critical Care Medicine

## 2019-04-24 ENCOUNTER — Other Ambulatory Visit: Payer: Self-pay

## 2019-04-24 ENCOUNTER — Encounter: Payer: Self-pay | Admitting: Critical Care Medicine

## 2019-04-24 VITALS — BP 156/94 | HR 71 | Temp 98.8°F | Ht 69.0 in | Wt 237.0 lb

## 2019-04-24 DIAGNOSIS — J449 Chronic obstructive pulmonary disease, unspecified: Secondary | ICD-10-CM | POA: Insufficient documentation

## 2019-04-24 DIAGNOSIS — Z5900 Homelessness unspecified: Secondary | ICD-10-CM

## 2019-04-24 DIAGNOSIS — Z79899 Other long term (current) drug therapy: Secondary | ICD-10-CM | POA: Insufficient documentation

## 2019-04-24 DIAGNOSIS — E119 Type 2 diabetes mellitus without complications: Secondary | ICD-10-CM | POA: Diagnosis present

## 2019-04-24 DIAGNOSIS — C50512 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: Secondary | ICD-10-CM | POA: Diagnosis not present

## 2019-04-24 DIAGNOSIS — F17210 Nicotine dependence, cigarettes, uncomplicated: Secondary | ICD-10-CM | POA: Insufficient documentation

## 2019-04-24 DIAGNOSIS — Z1211 Encounter for screening for malignant neoplasm of colon: Secondary | ICD-10-CM | POA: Insufficient documentation

## 2019-04-24 DIAGNOSIS — F329 Major depressive disorder, single episode, unspecified: Secondary | ICD-10-CM | POA: Insufficient documentation

## 2019-04-24 DIAGNOSIS — K029 Dental caries, unspecified: Secondary | ICD-10-CM | POA: Diagnosis not present

## 2019-04-24 DIAGNOSIS — I10 Essential (primary) hypertension: Secondary | ICD-10-CM | POA: Diagnosis not present

## 2019-04-24 DIAGNOSIS — F32A Depression, unspecified: Secondary | ICD-10-CM

## 2019-04-24 DIAGNOSIS — B351 Tinea unguium: Secondary | ICD-10-CM | POA: Insufficient documentation

## 2019-04-24 DIAGNOSIS — E1169 Type 2 diabetes mellitus with other specified complication: Secondary | ICD-10-CM

## 2019-04-24 DIAGNOSIS — F419 Anxiety disorder, unspecified: Secondary | ICD-10-CM | POA: Diagnosis not present

## 2019-04-24 DIAGNOSIS — E785 Hyperlipidemia, unspecified: Secondary | ICD-10-CM

## 2019-04-24 DIAGNOSIS — E1165 Type 2 diabetes mellitus with hyperglycemia: Secondary | ICD-10-CM

## 2019-04-24 DIAGNOSIS — Z794 Long term (current) use of insulin: Secondary | ICD-10-CM | POA: Diagnosis not present

## 2019-04-24 DIAGNOSIS — Z720 Tobacco use: Secondary | ICD-10-CM

## 2019-04-24 LAB — GLUCOSE, POCT (MANUAL RESULT ENTRY): POC Glucose: 150 mg/dl — AB (ref 70–99)

## 2019-04-24 LAB — POCT GLYCOSYLATED HEMOGLOBIN (HGB A1C): Hemoglobin A1C: 7.8 % — AB (ref 4.0–5.6)

## 2019-04-24 MED ORDER — TRAZODONE HCL 50 MG PO TABS: 50.0000 mg | ORAL_TABLET | Freq: Every day | ORAL | 3 refills | Status: AC

## 2019-04-24 MED ORDER — METFORMIN HCL 1000 MG PO TABS: 1000.0000 mg | ORAL_TABLET | Freq: Two times a day (BID) | ORAL | 3 refills | Status: DC

## 2019-04-24 MED ORDER — LOSARTAN POTASSIUM-HCTZ 100-12.5 MG PO TABS: 1.0000 | ORAL_TABLET | Freq: Every day | ORAL | 3 refills | Status: AC

## 2019-04-24 MED ORDER — ATORVASTATIN CALCIUM 40 MG PO TABS: 40.0000 mg | ORAL_TABLET | Freq: Every day | ORAL | 2 refills | Status: DC

## 2019-04-24 MED ORDER — ONETOUCH VERIO VI STRP: ORAL_STRIP | 11 refills | Status: AC

## 2019-04-24 MED ORDER — AMLODIPINE BESYLATE 10 MG PO TABS: 10.0000 mg | ORAL_TABLET | Freq: Every day | ORAL | 3 refills | Status: DC

## 2019-04-24 MED ORDER — SERTRALINE HCL 100 MG PO TABS: 100.0000 mg | ORAL_TABLET | Freq: Every day | ORAL | 3 refills | Status: AC

## 2019-04-24 MED ORDER — BUDESONIDE-FORMOTEROL FUMARATE 160-4.5 MCG/ACT IN AERO: 2.0000 | INHALATION_SPRAY | Freq: Two times a day (BID) | RESPIRATORY_TRACT | 3 refills | Status: DC

## 2019-04-24 MED ORDER — INSULIN GLARGINE 100 UNIT/ML ~~LOC~~ SOLN: 25.0000 [IU] | Freq: Every day | SUBCUTANEOUS | 2 refills | Status: AC

## 2019-04-24 NOTE — Assessment & Plan Note (Signed)
Ongoing tobacco use however it this is improved  Further smoking cessation counseling was given to the patient

## 2019-04-24 NOTE — Telephone Encounter (Signed)
Met with the patient when she was in the clinic today.  She explained that she was staying at the [employer] prior to the quarantine until about 2 weeks ago. She explained that she had been out of town visiting family and returned after curfew and was not allowed back.  She is currently staying with a friend but stated that it is not a comfortable environment for her. She said that she has been looking for apartments and has an appointment to see one today.  Current income is  $810/month. She hopes to find an apartment for < $450/month .  This CM provided her with the apartment listing from [employer] for additional resources as well as the phone number for the [employer]  She said that she believes that the [employer] will help her with her housing and [employer].  She is amenable to returning to the [employer] if allowed.  Provided her with the phone number for [facility] and she said that she was familiar with their services.  She is also interested in any resources from [employer] Ending Homelessness.    Call placed to [facility] Ending Homelessness. She stated that if the patient has been staying with someone for more than a week, she is not considered homeless and looses her homeless status. Debbie stated that the patient was at [employer] prior to staying at [employer].   Message sent to Hulan Fray, RN/CNP to inquire about patient's status with [employer].

## 2019-04-24 NOTE — Assessment & Plan Note (Signed)
The patient continues to score high on the anxiety and depression  We will continue her current program to include Zoloft and trazodone

## 2019-04-24 NOTE — Assessment & Plan Note (Signed)
COPD with asthma overlap syndrome stable at this time  Patient will continue Symbicort 2 puffs twice a day and as needed albuterol

## 2019-04-24 NOTE — Assessment & Plan Note (Signed)
Dental caries continue however the abscessed tooth has resolved itself

## 2019-04-24 NOTE — Assessment & Plan Note (Signed)
The patient's foot exam was relatively benign but there is a toenail fungus on the right great toe we will refer the patient back to podiatry for further review

## 2019-04-24 NOTE — Patient Instructions (Addendum)
Start losartan HCTZ 1 daily for blood pressure control  Continue amlodipine daily  No change in Lantus or metformin  Continue to focus on weight loss with diabetic diet as outlined below  All refills on all of your medications sent to the [facility]  See a list of resources for a diabetic eye exam please obtain this sometime this summer  We are working on getting you a referral to oncology and also make a referral to a foot doctor for the toenail fungus   Diabetes Mellitus and Nutrition, Adult When you have diabetes (diabetes mellitus), it is very important to have healthy eating habits because your blood sugar (glucose) levels are greatly affected by what you eat and drink. Eating healthy foods in the appropriate amounts, at about the same times every day, can help you:  Control your blood glucose.  Lower your risk of heart disease.  Improve your blood pressure.  Reach or maintain a healthy weight. Every person with diabetes is different, and each person has different needs for a meal plan. Your health care provider may recommend that you work with a diet and nutrition specialist (dietitian) to make a meal plan that is best for you. Your meal plan may vary depending on factors such as:  The calories you need.  The medicines you take.  Your weight.  Your blood glucose, blood pressure, and cholesterol levels.  Your activity level.  Other health conditions you have, such as heart or kidney disease. How do carbohydrates affect me? Carbohydrates, also called carbs, affect your blood glucose level more than any other type of food. Eating carbs naturally raises the amount of glucose in your blood. Carb counting is a method for keeping track of how many carbs you eat. Counting carbs is important to keep your blood glucose at a healthy level, especially if you use insulin or take certain oral diabetes medicines. It is important to know how many carbs you can safely have in  each meal. This is different for every person. Your dietitian can help you calculate how many carbs you should have at each meal and for each snack. Foods that contain carbs include:  Bread, cereal, rice, pasta, and crackers.  Potatoes and corn.  Peas, beans, and lentils.  Milk and yogurt.  Fruit and juice.  Desserts, such as cakes, cookies, ice cream, and candy. How does alcohol affect me? Alcohol can cause a sudden decrease in blood glucose (hypoglycemia), especially if you use insulin or take certain oral diabetes medicines. Hypoglycemia can be a life-threatening condition. Symptoms of hypoglycemia (sleepiness, dizziness, and confusion) are similar to symptoms of having too much alcohol. If your health care provider says that alcohol is safe for you, follow these guidelines:  Limit alcohol intake to no more than 1 drink per day for nonpregnant women and 2 drinks per day for men. One drink equals 12 oz of beer, 5 oz of wine, or 1 oz of hard liquor.  Do not drink on an empty stomach.  Keep yourself hydrated with water, diet soda, or unsweetened iced tea.  Keep in mind that regular soda, juice, and other mixers may contain a lot of sugar and must be counted as carbs. What are tips for following this plan?  Reading food labels  Start by checking the serving size on the "Nutrition Facts" label of packaged foods and drinks. The amount of calories, carbs, fats, and other nutrients listed on the label is based on one serving of the item. Many  items contain more than one serving per package.  Check the total grams (g) of carbs in one serving. You can calculate the number of servings of carbs in one serving by dividing the total carbs by 15. For example, if a food has 30 g of total carbs, it would be equal to 2 servings of carbs.  Check the number of grams (g) of saturated and trans fats in one serving. Choose foods that have low or no amount of these fats.  Check the number of  milligrams (mg) of salt (sodium) in one serving. Most people should limit total sodium intake to less than 2,300 mg per day.  Always check the nutrition information of foods labeled as "low-fat" or "nonfat". These foods may be higher in added sugar or refined carbs and should be avoided.  Talk to your dietitian to identify your daily goals for nutrients listed on the label. Shopping  Avoid buying canned, premade, or processed foods. These foods tend to be high in fat, sodium, and added sugar.  Shop around the outside edge of the grocery store. This includes fresh fruits and vegetables, bulk grains, fresh meats, and fresh dairy. Cooking  Use low-heat cooking methods, such as baking, instead of high-heat cooking methods like deep frying.  Cook using healthy oils, such as olive, canola, or sunflower oil.  Avoid cooking with butter, cream, or high-fat meats. Meal planning  Eat meals and snacks regularly, preferably at the same times every day. Avoid going long periods of time without eating.  Eat foods high in fiber, such as fresh fruits, vegetables, beans, and whole grains. Talk to your dietitian about how many servings of carbs you can eat at each meal.  Eat 4-6 ounces (oz) of lean protein each day, such as lean meat, chicken, fish, eggs, or tofu. One oz of lean protein is equal to: ? 1 oz of meat, chicken, or fish. ? 1 egg. ?  cup of tofu.  Eat some foods each day that contain healthy fats, such as avocado, nuts, seeds, and fish. Lifestyle  Check your blood glucose regularly.  Exercise regularly as told by your health care provider. This may include: ? 150 minutes of moderate-intensity or vigorous-intensity exercise each week. This could be brisk walking, biking, or water aerobics. ? Stretching and doing strength exercises, such as yoga or weightlifting, at least 2 times a week.  Take medicines as told by your health care provider.  Do not use any products that contain nicotine  or tobacco, such as cigarettes and e-cigarettes. If you need help quitting, ask your health care provider.  Work with a Social worker or diabetes educator to identify strategies to manage stress and any emotional and social challenges. Questions to ask a health care provider  Do I need to meet with a diabetes educator?  Do I need to meet with a dietitian?  What number can I call if I have questions?  When are the best times to check my blood glucose? Where to find more information:  American Diabetes Association: diabetes.org  Academy of Nutrition and Dietetics: www.eatright.[employer] of Diabetes and Digestive and Kidney Diseases (NIH): [employer] Summary  A healthy meal plan will help you control your blood glucose and maintain a healthy lifestyle.  Working with a diet and nutrition specialist (dietitian) can help you make a meal plan that is best for you.  Keep in mind that carbohydrates (carbs) and alcohol have immediate effects on your blood glucose levels. It is important  to count carbs and to use alcohol carefully. This information is not intended to replace advice given to you by your health care provider. Make sure you discuss any questions you have with your health care provider. Document Released: 07/01/2005 Document Revised: 09/16/2017 Document Reviewed: 11/08/2016 Elsevier Patient Education  2020 [employer].

## 2019-04-24 NOTE — [facility] Notes (Signed)
Subjective:    Patient ID: Lori Brown, female    DOB: 07/20/1961, 58 y.o.   MRN: [id number]  58 y.o. F with type 2 diabetes, hypertension, COPD, hyperlipidemia, localized breast cancer status post treatment  Patient was last seen in May as a telephone visit.  Note we were still trying to get her into oncology for post cancer treatment follow-up.  The appointment is yet to occur.  Note the patient is smoking but is down to only 1 cigarette every few days.  She does maintain amlodipine 10 mg daily and the Lantus and metformin.  Blood pressure however is been ranging up as she is under a great deal of stress.  The patient also has a history of dental problems and had a dental procedure done on her right lower jaw recently.  Also the patient has need for a tetanus vaccine but this was not done previously.   Patient is in need for refills of her various medications.  Note her CBG today in the office was 150.  The patient is also in need of a Pap smear and also eye exams and foot exam  Housing situation does remain quite tenuous she is no longer the [employer] and is living with a friend but it is an unstable situation.      Past Medical History:  Diagnosis Date  . Asthma   . COPD (chronic obstructive pulmonary disease) ([place])   . Diabetes mellitus without complication ([place])   . Hypertension   . Screening for colon cancer 09/29/2018     History reviewed. No pertinent family history.   Social History   Socioeconomic History  . Marital status: Single    Spouse name: Not on file  . Number of children: Not on file  . Years of education: Not on file  . Highest education level: Not on file  Occupational History  . Not on file  Social Needs  . Financial resource strain: Not on file  . Food insecurity    Worry: Not on file    Inability: Not on file  . Transportation needs    Medical: Not on file    Non-medical: Not on file  Tobacco Use  . Smoking status: Current Every  Day Smoker    Packs/day: 0.10    Types: Cigarettes  . Smokeless tobacco: Never Used  Substance and Sexual Activity  . Alcohol use: Yes    Comment: rarely   . Drug use: Not Currently  . Sexual activity: Not on file  Lifestyle  . Physical activity    Days per week: Not on file    Minutes per session: Not on file  . Stress: Not on file  Relationships  . Social Herbalist on phone: Not on file    Gets together: Not on file    Attends religious service: Not on file    Active member of club or organization: Not on file    Attends meetings of clubs or organizations: Not on file    Relationship status: Not on file  . Intimate partner violence    Fear of current or ex partner: Not on file    Emotionally abused: Not on file    Physically abused: Not on file    Forced sexual activity: Not on file  Other Topics Concern  . Not on file  Social History Narrative  . Not on file     No Known Allergies   Outpatient Medications Prior to  Visit  Medication Sig Dispense Refill  . albuterol (PROAIR HFA) 108 (90 Base) MCG/ACT inhaler Inhale 2 puffs into the lungs every 4 (four) hours as needed for wheezing or shortness of breath. 8.5 g 2  . albuterol (PROVENTIL) (2.5 MG/3ML) 0.083% nebulizer solution USE 1 VIAL VIA NEBULIZER THREE TIMES DAILY AS NEEDED 540 mL 0  . EPINEPHrine 0.3 mg/0.3 mL IJ SOAJ injection Use as directed for severe allergic reaction 2 Device 2  . ibuprofen (ADVIL) 600 MG tablet Take 1 tablet (600 mg total) by mouth every 8 (eight) hours as needed. 40 tablet 0  . ONETOUCH DELICA LANCETS 33A MISC Use to check blood sugar up to 3 times a day as directed. 100 each 11  . amLODipine (NORVASC) 10 MG tablet Take 1 tablet (10 mg total) by mouth daily. 30 tablet 3  . atorvastatin (LIPITOR) 40 MG tablet Take 1 tablet (40 mg total) by mouth daily. 30 tablet 2  . budesonide-formoterol (SYMBICORT) 160-4.5 MCG/ACT inhaler Inhale 2 puffs into the lungs 2 (two) times daily. 1 Inhaler  3  . glucose blood (ONETOUCH VERIO) test strip Use as instructed to check blood sugar up to 3 times daily. 100 each 11  . insulin glargine (LANTUS) 100 UNIT/ML injection Inject 0.25 mLs (25 Units total) into the skin at bedtime. 10 mL 2  . metFORMIN (GLUCOPHAGE) 1000 MG tablet Take 1 tablet (1,000 mg total) by mouth 2 (two) times daily with a meal. 60 tablet 3  . sertraline (ZOLOFT) 100 MG tablet Take 1 tablet (100 mg total) by mouth daily. 30 tablet 3  . traZODone (DESYREL) 50 MG tablet Take 1 tablet (50 mg total) by mouth at bedtime. 30 tablet 3   No facility-administered medications prior to visit.      Review of Systems  Constitutional: Negative.   HENT: Negative.   Respiratory: Negative for cough, chest tightness, shortness of breath and wheezing.   Cardiovascular: Negative for chest pain.  Gastrointestinal: Negative.   Genitourinary: Negative.   Musculoskeletal: Negative.   Skin: Negative for rash.  Neurological: Negative.   Hematological: Negative.   Psychiatric/Behavioral: Negative.        Objective:   Physical Exam Vitals:   04/24/19 1338 04/24/19 1343  BP: (!) 156/99 (!) 156/94  Pulse: 71   Temp: 98.8 F (37.1 C)   TempSrc: Oral   SpO2: 98%   Weight: 237 lb (107.5 kg)   Height: 5\' 9"  (1.753 m)    Wt Readings from Last 3 Encounters:  04/24/19 237 lb (107.5 kg)  02/13/19 240 lb (108.9 kg)  01/10/19 241 lb (109.3 kg)   Gen: Pleasant, obese, in no distress,  normal affect  ENT: No lesions,  mouth prominent posterior oral candidiasis,  oropharynx clear, no postnasal drip  Neck: No JVD, no TMG, no carotid bruits  Lungs: No use of accessory muscles, no dullness to percussion, distant breath sounds, scattered expiratory wheezes   cardiovascular: RRR, heart sounds normal, no murmur or gallops  no peripheral edema  Abdomen: soft and NT, no HSM,  BS normal  Musculoskeletal: No deformities, no cyanosis or clubbing  Neuro: alert, non focal  Skin: Warm, no  lesions or rashes     Assessment & Plan:  I personally reviewed all images and lab data in the [facility] system as well as any outside material available during this office visit and agree with the  radiology impressions.   HTN (hypertension) Hypertension with poor control  Plan will be for the patient  to begin losartan 100 mg HCTZ 12.5 mg combination 1 daily and to continue amlodipine 10 mg daily  COPD with asthma ([place]) COPD with asthma overlap syndrome stable at this time  Patient will continue Symbicort 2 puffs twice a day and as needed albuterol  Dental caries into pulp Dental caries continue however the abscessed tooth has resolved itself  DM (diabetes mellitus), type 2, uncontrolled ([place]) Type 2 diabetes poorly controlled  We will follow-up hemoglobin A1c today and continue the Lantus and metformin at current dose level  We reviewed a diabetic diet with the patient at this visit  We gave the patient references for potential diabetic retinal exams to be performed  Toenail fungus The patient's foot exam was relatively benign but there is a toenail fungus on the right great toe we will refer the patient back to podiatry for further review  Homelessness Homelessness situation persistently have asked the case manager to meet with the patient today  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 [facility]) We have been trying for several months to get this patient appointment in the oncology clinic records have now been obtained from her previous oncologist in [facility] we will now attempt to connect the patient with a new oncologist for follow-up  Tobacco user Ongoing tobacco use however it this is improved  Further smoking cessation counseling was given to the patient  Anxiety and depression The patient continues to score high on the anxiety and depression  We will continue her current program to include Zoloft and trazodone   Kensi was seen today  for copd, diabetes and medication refill.  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 -     amLODipine (NORVASC) 10 MG tablet; Take 1 tablet (10 mg total) by mouth daily. -     atorvastatin (LIPITOR) 40 MG tablet; Take 1 tablet (40 mg total) by mouth daily. -     budesonide-formoterol (SYMBICORT) 160-4.5 MCG/ACT inhaler; Inhale 2 puffs into the lungs 2 (two) times daily. -     insulin glargine (LANTUS) 100 UNIT/ML injection; Inject 0.25 mLs (25 Units total) into the skin at bedtime. -     metFORMIN (GLUCOPHAGE) 1000 MG tablet; Take 1 tablet (1,000 mg total) by mouth 2 (two) times daily with a meal. -     sertraline (ZOLOFT) 100 MG tablet; Take 1 tablet (100 mg total) by mouth daily. -     traZODone (DESYREL) 50 MG tablet; Take 1 tablet (50 mg total) by mouth at bedtime. -     losartan-hydrochlorothiazide (HYZAAR) 100-12.5 MG tablet; Take 1 tablet by mouth daily.

## 2019-04-24 NOTE — Assessment & Plan Note (Signed)
Hypertension with poor control  Plan will be for the patient to begin losartan 100 mg HCTZ 12.5 mg combination 1 daily and to continue amlodipine 10 mg daily

## 2019-04-24 NOTE — Telephone Encounter (Signed)
[place] for Lori Brown to call office or patient to sch appt per referral that was placed by provider. Office number was left on voicemail and patient MRN and last name and DOB was also left on voicemail.

## 2019-04-24 NOTE — Assessment & Plan Note (Signed)
Homelessness situation persistently have asked the case manager to meet with the patient today

## 2019-04-24 NOTE — Assessment & Plan Note (Signed)
We have been trying for several months to get this patient appointment in the oncology clinic records have now been obtained from her previous oncologist in [facility] we will now attempt to connect the patient with a new oncologist for follow-up

## 2019-04-24 NOTE — Assessment & Plan Note (Addendum)
Type 2 diabetes poorly controlled  We will follow-up hemoglobin A1c today and continue the Lantus and metformin at current dose level  We reviewed a diabetic diet with the patient at this visit  We gave the patient references for potential diabetic retinal exams to be performed

## 2019-04-27 IMAGING — CR DG CHEST 2V
2 series · 2 of 2 positions shown · non-contrast
Comparison: None.

CLINICAL DATA: Worsening cough, chest congestion, and shortness of
breath over the past 2 days. Occasional smoker.

EXAM:
CHEST - 2 VIEW

[w chest pa]
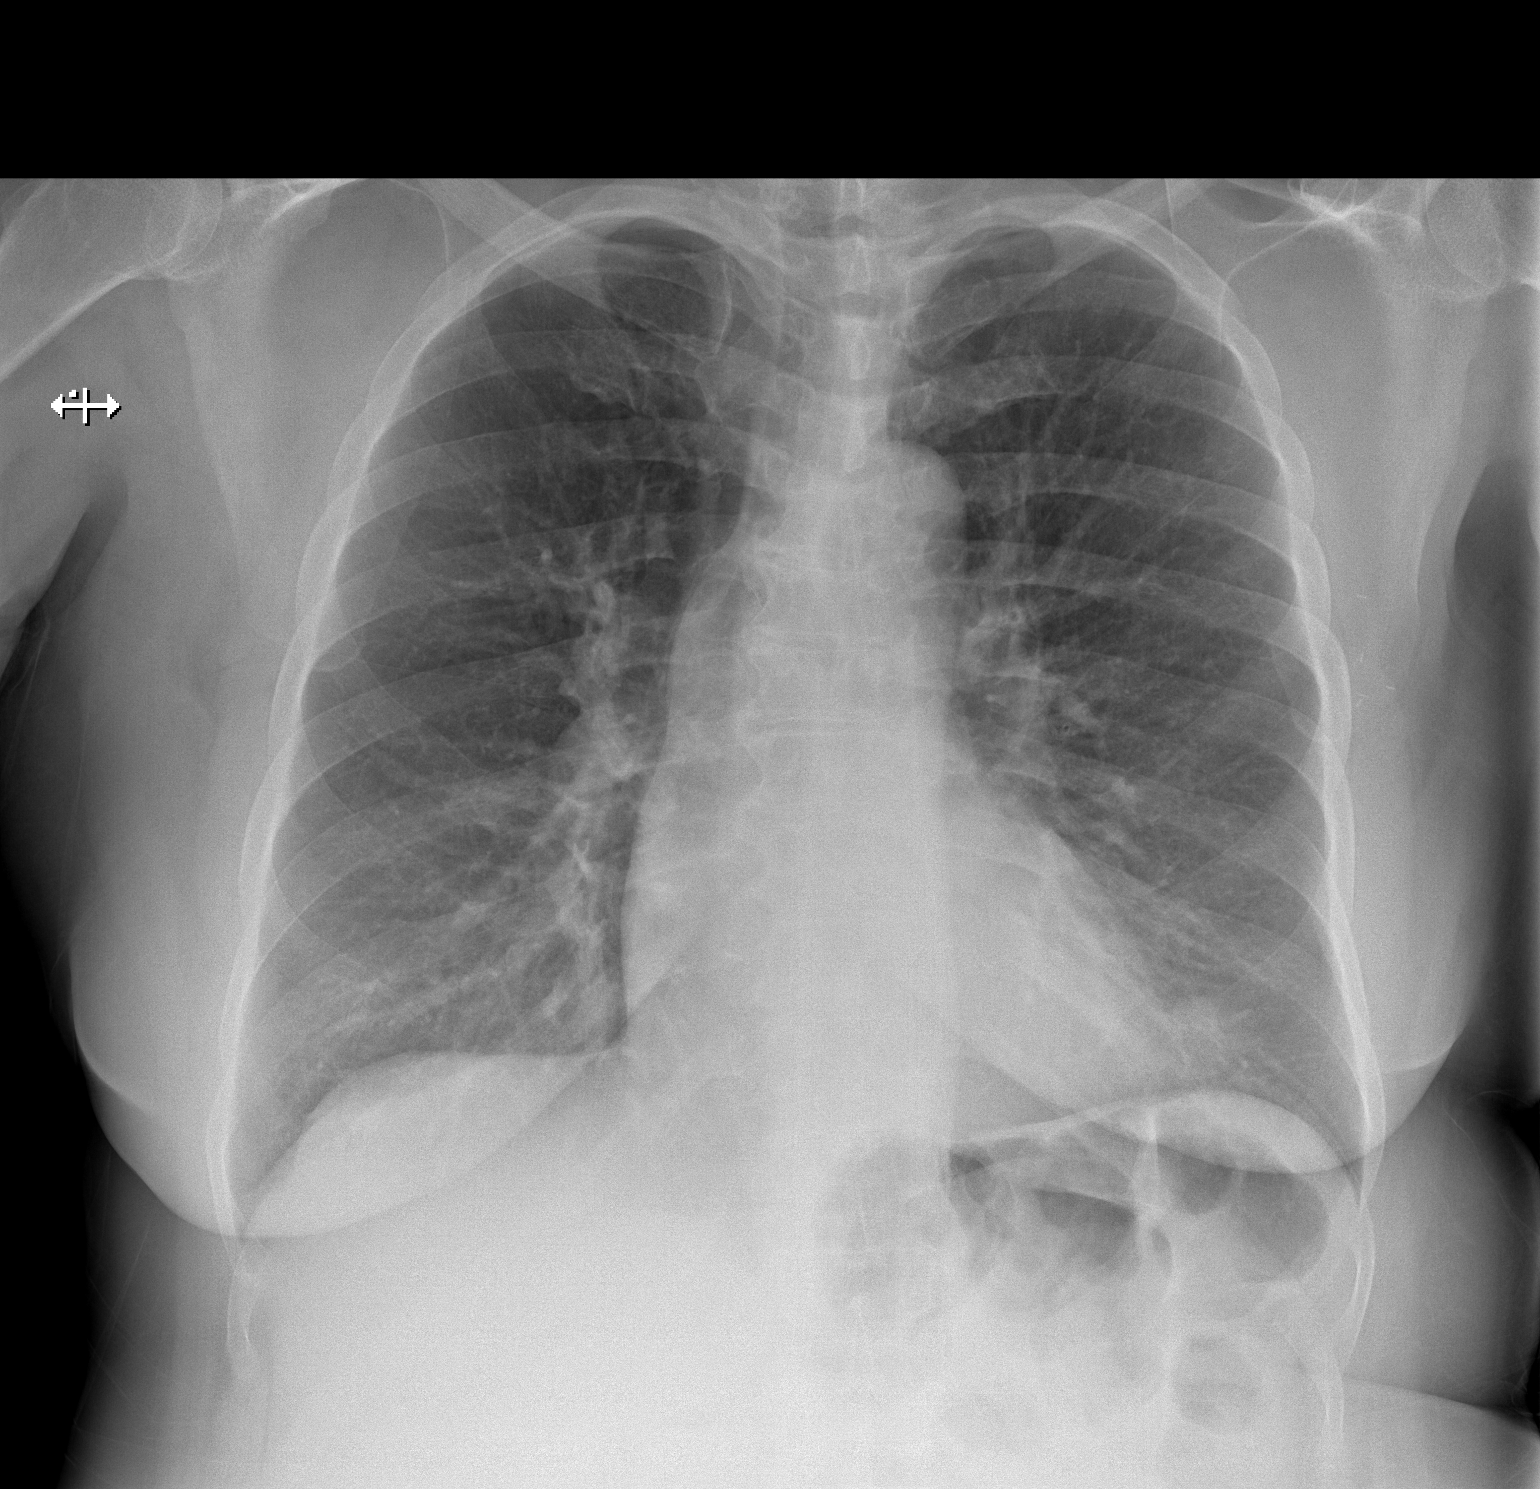

[w chest lat]
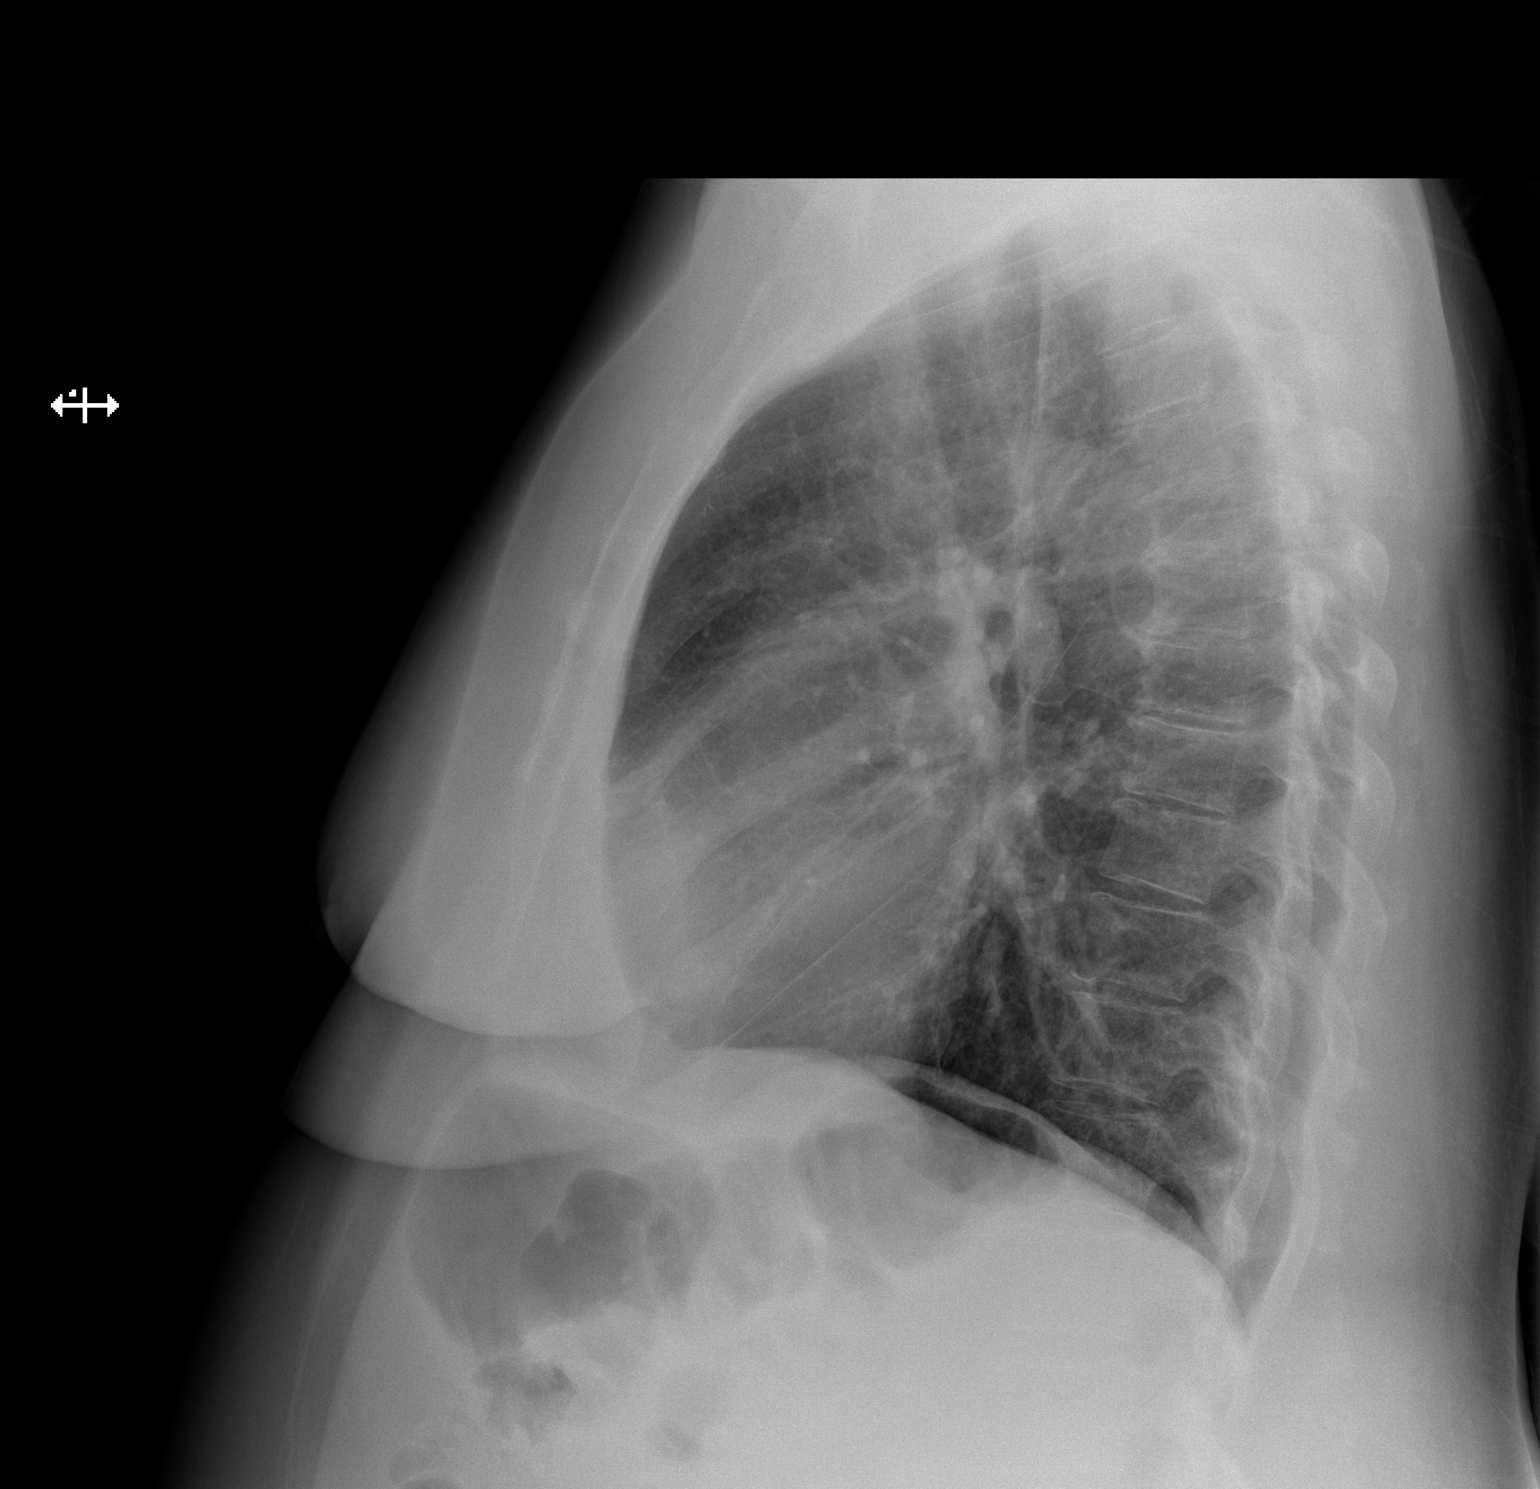

[2 of 2 positions shown; findings below may reference images not displayed]

FINDINGS: The lungs are well-expanded. The interstitial markings are mildly
prominent. There is no alveolar infiltrate. A nipple shadow is noted
on the left lateral to the cardiac apex. The heart and pulmonary
vascularity are normal. There is no pleural effusion. The trachea is
midline. The bony thorax exhibits no acute abnormality.
IMPRESSION: Bronchitic changes which may be acute or chronic or a combination of
the two. No alveolar pneumonia nor pulmonary edema.

## 2019-05-03 ENCOUNTER — Telehealth: Payer: Self-pay

## 2019-05-03 NOTE — Telephone Encounter (Signed)
This CM spoke to Hulan Fray, RN /CN very briefly. She was unable to talk and said that she would call back.

## 2019-05-09 ENCOUNTER — Encounter: Payer: Self-pay | Admitting: Podiatry

## 2019-05-09 ENCOUNTER — Other Ambulatory Visit: Payer: Self-pay

## 2019-05-09 ENCOUNTER — Ambulatory Visit (INDEPENDENT_AMBULATORY_CARE_PROVIDER_SITE_OTHER): Payer: Medicare Other | Admitting: Podiatry

## 2019-05-09 VITALS — BP 145/80 | HR 92 | Temp 97.6°F

## 2019-05-09 DIAGNOSIS — L603 Nail dystrophy: Secondary | ICD-10-CM | POA: Diagnosis not present

## 2019-05-15 IMAGING — DX DG CHEST 2V
2 series · 2 of 2 positions shown · non-contrast
Comparison: 09/06/2018

CLINICAL DATA: Productive cough and shortness of breath.

EXAM:
CHEST - 2 VIEW

[chest pa]
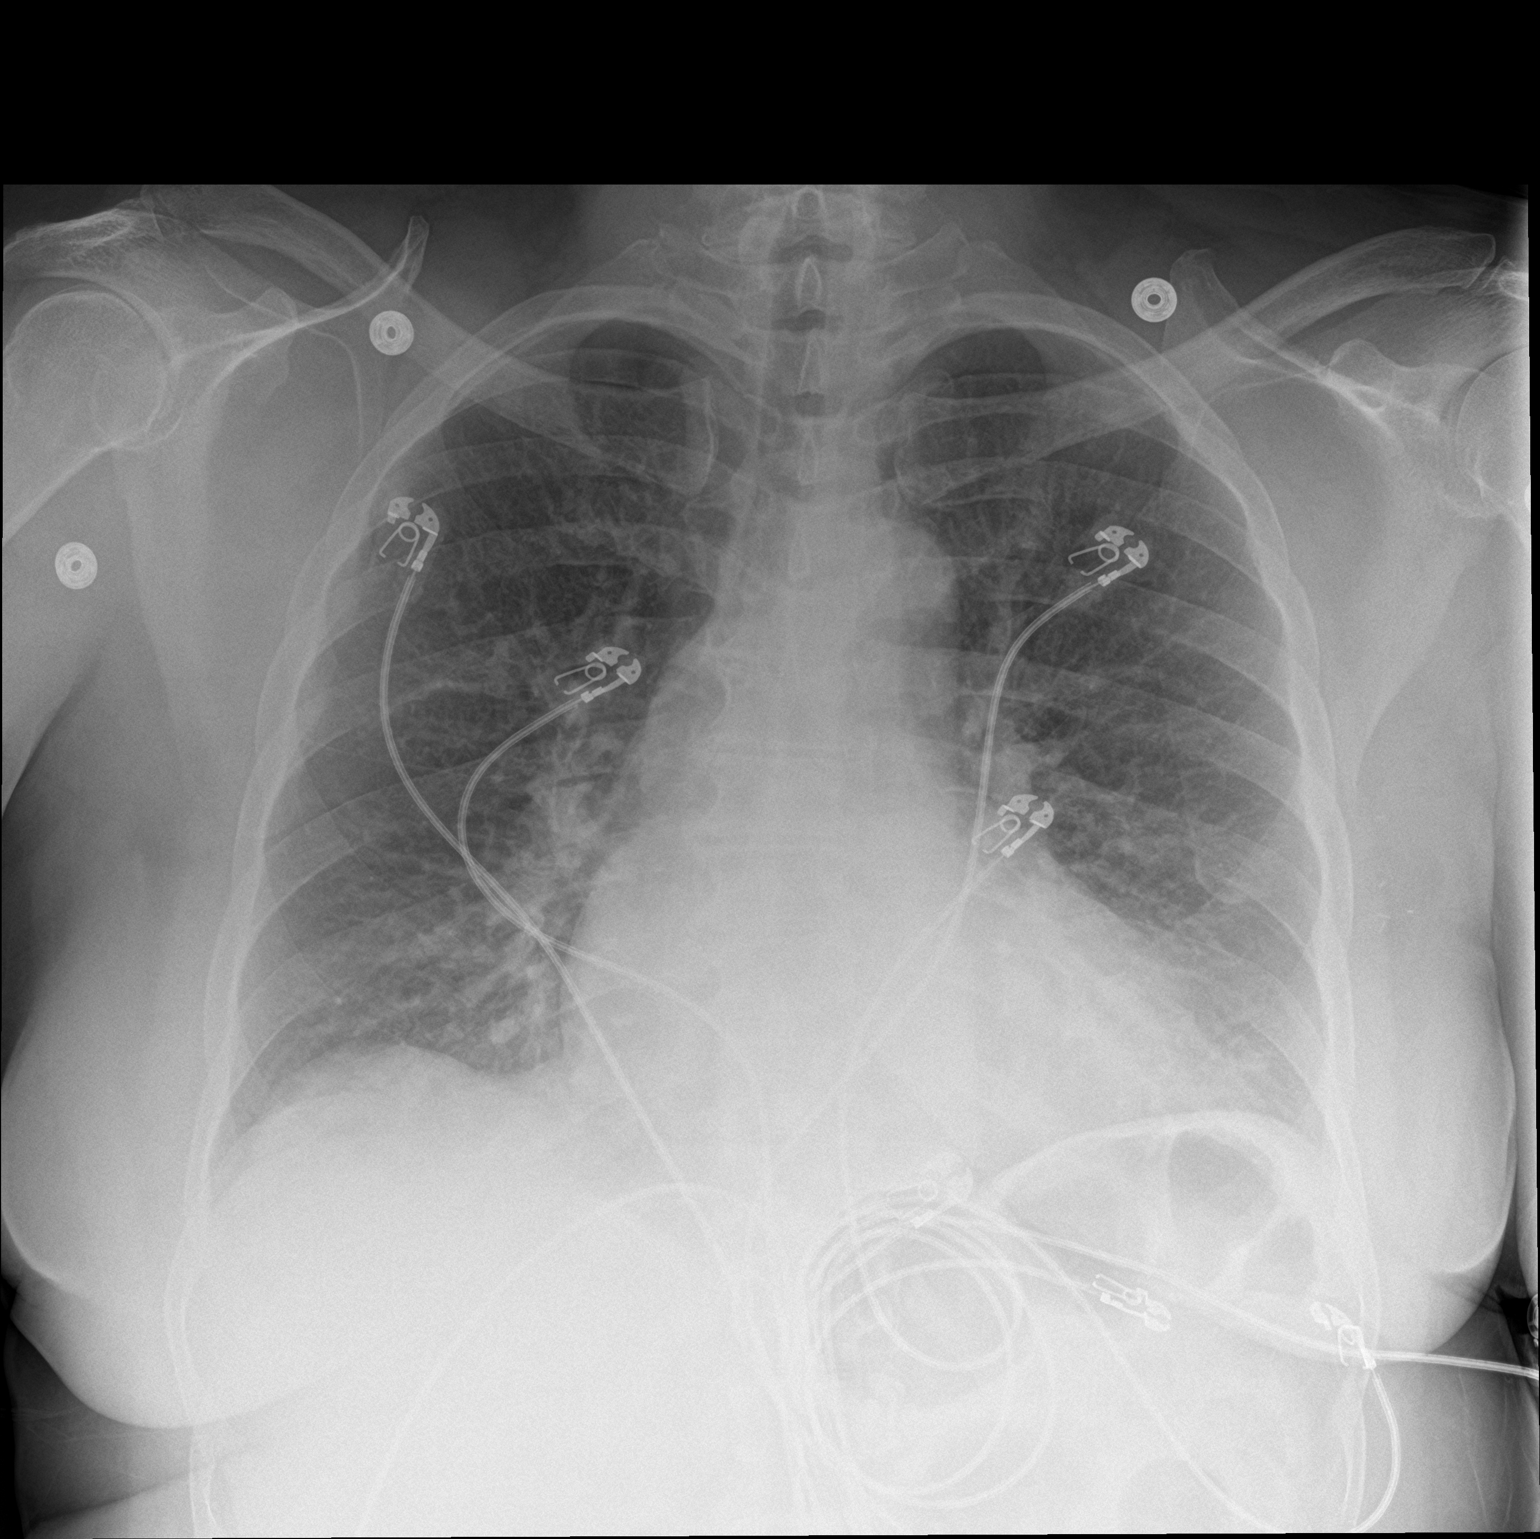

[chest lat]
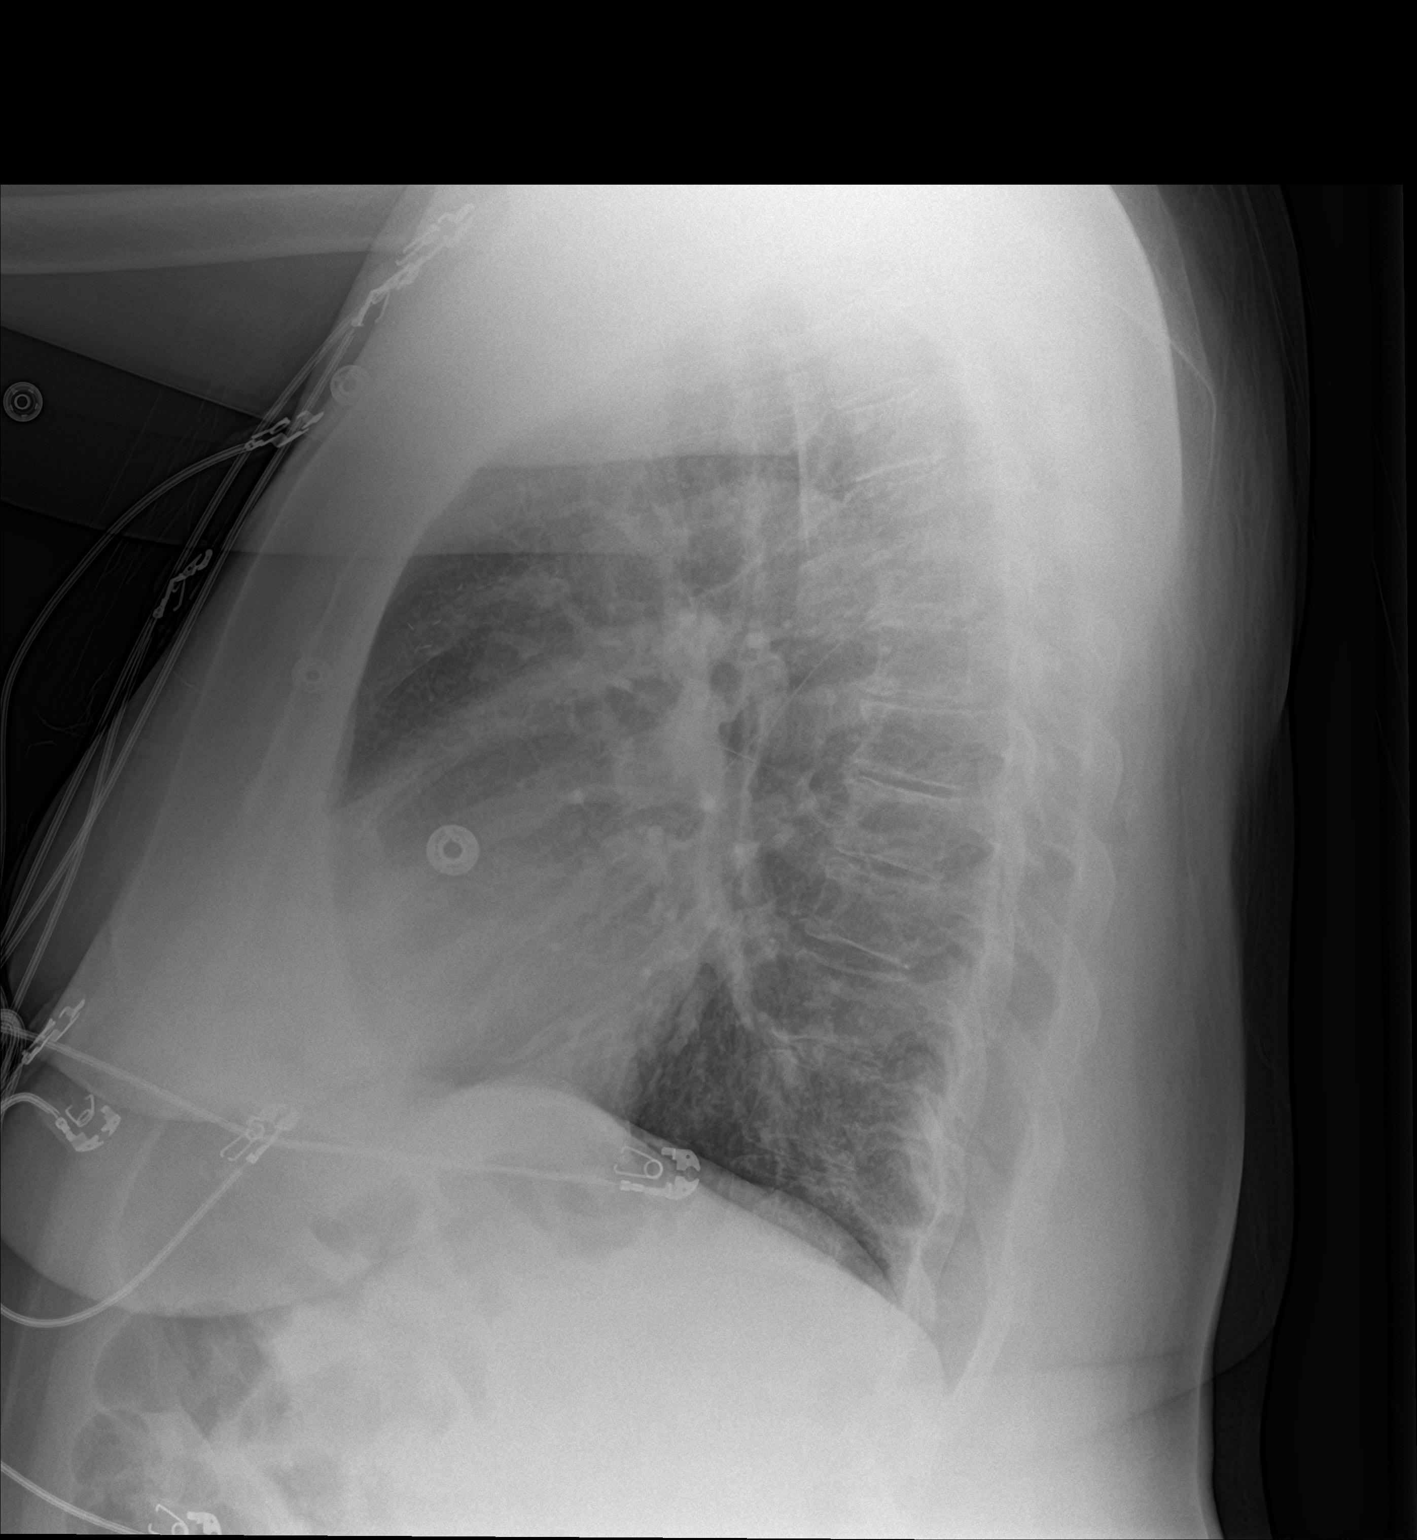

[2 of 2 positions shown; findings below may reference images not displayed]

FINDINGS: The heart is mildly enlarged. Stable mild bronchial and interstitial
prominence which likely reflects chronic disease. A component of
acute bronchitis cannot be excluded. There is mild bibasilar
atelectasis. There is no evidence of pulmonary edema, consolidation,
pneumothorax, nodule or pleural fluid.
IMPRESSION: Stable interstitial and bronchial prominence. This could be
reflective of chronic disease but component of acute bronchitis may
be present.

## 2019-05-17 NOTE — Progress Notes (Signed)
   HPI: 58 y.o. female with PMHx of T2DM presenting today as a new patient with a chief complaint of possible nail fungus of the right great toenail that has been present for several months. She reports associated thickening and discoloration of the nail. She states she was seen by her PCP about two weeks ago on 04/24/2019 and was diagnosed with possible nail fungus. She has not had any treatment for the symptoms. She denies modifying factors. Patient is here for further evaluation and treatment.   Past Medical History:  Diagnosis Date  . Asthma   . COPD (chronic obstructive pulmonary disease) ([place])   . Diabetes mellitus without complication ([place])   . Hypertension   . Screening for colon cancer 09/29/2018     Physical Exam: General: The patient is alert and oriented x3 in no acute distress.  Dermatology: Hyperkeratotic, discolored, thickened, onychodystrophy of the right great toenail. Skin is warm, dry and supple bilateral lower extremities. Negative for open lesions or macerations.  Vascular: Palpable pedal pulses bilaterally. No edema or erythema noted. Capillary refill within normal limits.  Neurological: Epicritic and protective threshold grossly intact bilaterally.   Musculoskeletal Exam: Range of motion within normal limits to all pedal and ankle joints bilateral. Muscle strength 5/5 in all groups bilateral.   Assessment: 1. Dystrophic nail right great toe   Plan of Care:  1. Patient evaluated. 2. Mechanical debridement of the right great toenail performed using a nail nipper. Filed with dremel without incident.  3. Return to clinic as needed.      Edrick Kins, DPM [facility]  Dr. Edrick Kins, DPM    [address], [place] [address]                Office [phone]  Fax [phone]

## 2019-07-17 ENCOUNTER — Other Ambulatory Visit: Payer: Self-pay | Admitting: Critical Care Medicine

## 2019-07-17 DIAGNOSIS — J449 Chronic obstructive pulmonary disease, unspecified: Secondary | ICD-10-CM

## 2019-07-29 ENCOUNTER — Other Ambulatory Visit: Payer: Self-pay | Admitting: Critical Care Medicine

## 2019-08-30 ENCOUNTER — Other Ambulatory Visit: Payer: Self-pay | Admitting: Critical Care Medicine

## 2019-09-01 ENCOUNTER — Other Ambulatory Visit: Payer: Self-pay | Admitting: Critical Care Medicine

## 2019-09-03 ENCOUNTER — Other Ambulatory Visit: Payer: Self-pay | Admitting: Critical Care Medicine

## 2019-09-05 ENCOUNTER — Other Ambulatory Visit: Payer: Self-pay | Admitting: Critical Care Medicine

## 2019-09-05 DIAGNOSIS — J449 Chronic obstructive pulmonary disease, unspecified: Secondary | ICD-10-CM

## 2019-09-27 ENCOUNTER — Other Ambulatory Visit: Payer: Self-pay | Admitting: Critical Care Medicine

## 2019-09-27 DIAGNOSIS — J449 Chronic obstructive pulmonary disease, unspecified: Secondary | ICD-10-CM

## 2019-10-13 ENCOUNTER — Other Ambulatory Visit: Payer: Self-pay | Admitting: Critical Care Medicine

## 2019-10-13 DIAGNOSIS — J449 Chronic obstructive pulmonary disease, unspecified: Secondary | ICD-10-CM

## 2019-10-20 ENCOUNTER — Other Ambulatory Visit: Payer: Self-pay | Admitting: Critical Care Medicine

## 2019-10-29 ENCOUNTER — Other Ambulatory Visit: Payer: Self-pay | Admitting: Critical Care Medicine

## 2019-11-25 ENCOUNTER — Other Ambulatory Visit: Payer: Self-pay | Admitting: Critical Care Medicine
